# Patient Record
Sex: Female | Born: 1975 | Race: White | Hispanic: No | Smoking: Current every day smoker
Health system: Southern US, Community
[De-identification: ages and names within clinical notes are randomized; demographics above are authoritative.]

## PROBLEM LIST (undated history)

## (undated) DIAGNOSIS — J189 Pneumonia, unspecified organism: Secondary | ICD-10-CM

## (undated) DIAGNOSIS — R519 Headache, unspecified: Secondary | ICD-10-CM

## (undated) DIAGNOSIS — F32A Depression, unspecified: Secondary | ICD-10-CM

## (undated) DIAGNOSIS — R55 Syncope and collapse: Secondary | ICD-10-CM

## (undated) DIAGNOSIS — R51 Headache: Secondary | ICD-10-CM

## (undated) DIAGNOSIS — F329 Major depressive disorder, single episode, unspecified: Secondary | ICD-10-CM

## (undated) DIAGNOSIS — I10 Essential (primary) hypertension: Secondary | ICD-10-CM

## (undated) DIAGNOSIS — F419 Anxiety disorder, unspecified: Secondary | ICD-10-CM

## (undated) DIAGNOSIS — I639 Cerebral infarction, unspecified: Secondary | ICD-10-CM

## (undated) DIAGNOSIS — G35 Multiple sclerosis: Secondary | ICD-10-CM

## (undated) DIAGNOSIS — Z9289 Personal history of other medical treatment: Secondary | ICD-10-CM

## (undated) DIAGNOSIS — IMO0002 Reserved for concepts with insufficient information to code with codable children: Secondary | ICD-10-CM

## (undated) DIAGNOSIS — J45909 Unspecified asthma, uncomplicated: Secondary | ICD-10-CM

## (undated) HISTORY — PX: WISDOM TOOTH EXTRACTION: SHX21

## (undated) HISTORY — PX: OTHER SURGICAL HISTORY: SHX169

## (undated) HISTORY — PX: TUBAL LIGATION: SHX77

---
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
## 1998-03-20 ENCOUNTER — Emergency Department (HOSPITAL_COMMUNITY): Admission: EM | Admit: 1998-03-20 | Discharge: 1998-03-20 | Payer: Self-pay | Admitting: Emergency Medicine

## 1998-11-09 ENCOUNTER — Emergency Department (HOSPITAL_COMMUNITY): Admission: EM | Admit: 1998-11-09 | Discharge: 1998-11-09 | Payer: Self-pay | Admitting: Emergency Medicine

## 1998-12-08 ENCOUNTER — Other Ambulatory Visit: Admission: RE | Admit: 1998-12-08 | Discharge: 1998-12-08 | Payer: Self-pay | Admitting: Obstetrics and Gynecology

## 1999-05-06 ENCOUNTER — Inpatient Hospital Stay (HOSPITAL_COMMUNITY): Admission: AD | Admit: 1999-05-06 | Discharge: 1999-05-06 | Payer: Self-pay | Admitting: *Deleted

## 1999-06-17 ENCOUNTER — Inpatient Hospital Stay (HOSPITAL_COMMUNITY): Admission: AD | Admit: 1999-06-17 | Discharge: 1999-06-19 | Payer: Self-pay | Admitting: Obstetrics and Gynecology

## 2000-02-22 ENCOUNTER — Other Ambulatory Visit: Admission: RE | Admit: 2000-02-22 | Discharge: 2000-02-22 | Payer: Self-pay | Admitting: Family Medicine

## 2000-07-25 ENCOUNTER — Emergency Department (HOSPITAL_COMMUNITY): Admission: EM | Admit: 2000-07-25 | Discharge: 2000-07-25 | Payer: Self-pay | Admitting: *Deleted

## 2001-03-18 ENCOUNTER — Encounter: Payer: Self-pay | Admitting: *Deleted

## 2001-03-18 ENCOUNTER — Emergency Department (HOSPITAL_COMMUNITY): Admission: EM | Admit: 2001-03-18 | Discharge: 2001-03-18 | Payer: Self-pay | Admitting: *Deleted

## 2001-06-27 ENCOUNTER — Encounter: Payer: Self-pay | Admitting: Obstetrics and Gynecology

## 2001-06-27 ENCOUNTER — Emergency Department (HOSPITAL_COMMUNITY): Admission: EM | Admit: 2001-06-27 | Discharge: 2001-06-27 | Payer: Self-pay | Admitting: Emergency Medicine

## 2001-07-08 ENCOUNTER — Emergency Department (HOSPITAL_COMMUNITY): Admission: EM | Admit: 2001-07-08 | Discharge: 2001-07-08 | Payer: Self-pay | Admitting: *Deleted

## 2001-08-26 ENCOUNTER — Encounter: Payer: Self-pay | Admitting: Emergency Medicine

## 2001-08-26 ENCOUNTER — Emergency Department (HOSPITAL_COMMUNITY): Admission: EM | Admit: 2001-08-26 | Discharge: 2001-08-26 | Payer: Self-pay | Admitting: Emergency Medicine

## 2001-09-19 ENCOUNTER — Ambulatory Visit (HOSPITAL_COMMUNITY): Admission: RE | Admit: 2001-09-19 | Discharge: 2001-09-19 | Payer: Self-pay | Admitting: *Deleted

## 2001-09-19 ENCOUNTER — Encounter: Payer: Self-pay | Admitting: *Deleted

## 2001-12-01 ENCOUNTER — Inpatient Hospital Stay (HOSPITAL_COMMUNITY): Admission: AD | Admit: 2001-12-01 | Discharge: 2001-12-06 | Payer: Self-pay | Admitting: *Deleted

## 2001-12-02 ENCOUNTER — Encounter: Payer: Self-pay | Admitting: Obstetrics & Gynecology

## 2001-12-03 ENCOUNTER — Encounter: Payer: Self-pay | Admitting: Obstetrics & Gynecology

## 2002-01-30 ENCOUNTER — Ambulatory Visit (HOSPITAL_COMMUNITY): Admission: AD | Admit: 2002-01-30 | Discharge: 2002-01-30 | Payer: Self-pay | Admitting: *Deleted

## 2002-02-02 ENCOUNTER — Ambulatory Visit (HOSPITAL_COMMUNITY): Admission: AD | Admit: 2002-02-02 | Discharge: 2002-02-02 | Payer: Self-pay | Admitting: *Deleted

## 2002-02-06 ENCOUNTER — Inpatient Hospital Stay (HOSPITAL_COMMUNITY): Admission: AD | Admit: 2002-02-06 | Discharge: 2002-02-08 | Payer: Self-pay | Admitting: *Deleted

## 2002-12-04 ENCOUNTER — Emergency Department (HOSPITAL_COMMUNITY): Admission: EM | Admit: 2002-12-04 | Discharge: 2002-12-04 | Payer: Self-pay | Admitting: *Deleted

## 2002-12-06 ENCOUNTER — Ambulatory Visit (HOSPITAL_COMMUNITY): Admission: RE | Admit: 2002-12-06 | Discharge: 2002-12-06 | Payer: Self-pay | Admitting: *Deleted

## 2002-12-06 ENCOUNTER — Encounter: Payer: Self-pay | Admitting: *Deleted

## 2003-02-17 ENCOUNTER — Observation Stay (HOSPITAL_COMMUNITY): Admission: EM | Admit: 2003-02-17 | Discharge: 2003-02-17 | Payer: Self-pay | Admitting: *Deleted

## 2003-02-17 ENCOUNTER — Encounter: Payer: Self-pay | Admitting: Obstetrics and Gynecology

## 2003-10-01 ENCOUNTER — Ambulatory Visit (HOSPITAL_COMMUNITY): Admission: AD | Admit: 2003-10-01 | Discharge: 2003-10-01 | Payer: Self-pay | Admitting: Obstetrics & Gynecology

## 2004-10-02 ENCOUNTER — Emergency Department (HOSPITAL_COMMUNITY): Admission: EM | Admit: 2004-10-02 | Discharge: 2004-10-03 | Payer: Self-pay | Admitting: Emergency Medicine

## 2005-04-12 ENCOUNTER — Other Ambulatory Visit: Admission: RE | Admit: 2005-04-12 | Discharge: 2005-04-12 | Payer: Self-pay | Admitting: General Surgery

## 2005-04-30 ENCOUNTER — Inpatient Hospital Stay (HOSPITAL_COMMUNITY): Admission: EM | Admit: 2005-04-30 | Discharge: 2005-05-04 | Payer: Self-pay | Admitting: Emergency Medicine

## 2005-05-01 ENCOUNTER — Ambulatory Visit: Payer: Self-pay | Admitting: Physical Medicine & Rehabilitation

## 2005-05-02 ENCOUNTER — Encounter (INDEPENDENT_AMBULATORY_CARE_PROVIDER_SITE_OTHER): Payer: Self-pay | Admitting: Cardiology

## 2005-05-03 ENCOUNTER — Encounter: Payer: Self-pay | Admitting: Neurology

## 2005-05-24 ENCOUNTER — Encounter (HOSPITAL_COMMUNITY): Admission: RE | Admit: 2005-05-24 | Discharge: 2005-06-23 | Payer: Self-pay | Admitting: Neurology

## 2005-07-08 ENCOUNTER — Encounter (HOSPITAL_COMMUNITY): Admission: RE | Admit: 2005-07-08 | Discharge: 2005-07-22 | Payer: Self-pay | Admitting: Neurology

## 2006-01-31 ENCOUNTER — Emergency Department (HOSPITAL_COMMUNITY): Admission: EM | Admit: 2006-01-31 | Discharge: 2006-02-01 | Payer: Self-pay | Admitting: Emergency Medicine

## 2006-07-05 ENCOUNTER — Emergency Department (HOSPITAL_COMMUNITY): Admission: EM | Admit: 2006-07-05 | Discharge: 2006-07-05 | Payer: Self-pay | Admitting: Emergency Medicine

## 2006-07-08 ENCOUNTER — Emergency Department (HOSPITAL_COMMUNITY): Admission: EM | Admit: 2006-07-08 | Discharge: 2006-07-08 | Payer: Self-pay | Admitting: Emergency Medicine

## 2006-11-23 ENCOUNTER — Encounter (INDEPENDENT_AMBULATORY_CARE_PROVIDER_SITE_OTHER): Payer: Self-pay | Admitting: *Deleted

## 2006-11-23 ENCOUNTER — Inpatient Hospital Stay (HOSPITAL_COMMUNITY): Admission: EM | Admit: 2006-11-23 | Discharge: 2006-11-23 | Payer: Self-pay | Admitting: Emergency Medicine

## 2008-03-20 ENCOUNTER — Other Ambulatory Visit: Admission: RE | Admit: 2008-03-20 | Discharge: 2008-03-20 | Payer: Self-pay | Admitting: General Surgery

## 2008-07-19 ENCOUNTER — Emergency Department (HOSPITAL_COMMUNITY): Admission: EM | Admit: 2008-07-19 | Discharge: 2008-07-19 | Payer: Self-pay | Admitting: Emergency Medicine

## 2008-08-27 ENCOUNTER — Emergency Department (HOSPITAL_COMMUNITY): Admission: EM | Admit: 2008-08-27 | Discharge: 2008-08-27 | Payer: Self-pay | Admitting: Emergency Medicine

## 2009-03-23 ENCOUNTER — Emergency Department (HOSPITAL_COMMUNITY): Admission: EM | Admit: 2009-03-23 | Discharge: 2009-03-23 | Payer: Self-pay | Admitting: Emergency Medicine

## 2010-01-19 ENCOUNTER — Emergency Department (HOSPITAL_COMMUNITY): Admission: EM | Admit: 2010-01-19 | Discharge: 2010-01-19 | Payer: Self-pay | Admitting: Emergency Medicine

## 2010-02-04 ENCOUNTER — Emergency Department (HOSPITAL_COMMUNITY): Admission: EM | Admit: 2010-02-04 | Discharge: 2010-02-04 | Payer: Self-pay | Admitting: Emergency Medicine

## 2010-04-23 ENCOUNTER — Emergency Department (HOSPITAL_COMMUNITY): Admission: EM | Admit: 2010-04-23 | Discharge: 2010-04-23 | Payer: Self-pay | Admitting: Emergency Medicine

## 2010-09-04 ENCOUNTER — Emergency Department (HOSPITAL_COMMUNITY)
Admission: EM | Admit: 2010-09-04 | Discharge: 2010-09-04 | Payer: Self-pay | Source: Home / Self Care | Admitting: Oncology

## 2010-09-04 LAB — URINE MICROSCOPIC-ADD ON

## 2010-09-04 LAB — URINALYSIS, ROUTINE W REFLEX MICROSCOPIC
Bilirubin Urine: NEGATIVE
Hgb urine dipstick: NEGATIVE
Ketones, ur: NEGATIVE mg/dL
Nitrite: POSITIVE — AB
Protein, ur: NEGATIVE mg/dL
Specific Gravity, Urine: 1.01 (ref 1.005–1.030)
Urine Glucose, Fasting: NEGATIVE mg/dL
Urobilinogen, UA: 0.2 mg/dL (ref 0.0–1.0)
pH: 6.5 (ref 5.0–8.0)

## 2010-10-25 LAB — DIFFERENTIAL
Basophils Absolute: 0 10*3/uL (ref 0.0–0.1)
Basophils Relative: 0 % (ref 0–1)
Eosinophils Absolute: 0.1 10*3/uL (ref 0.0–0.7)
Eosinophils Relative: 1 % (ref 0–5)
Lymphocytes Relative: 18 % (ref 12–46)
Lymphs Abs: 2.1 10*3/uL (ref 0.7–4.0)
Monocytes Absolute: 0.9 10*3/uL (ref 0.1–1.0)
Monocytes Relative: 8 % (ref 3–12)
Neutro Abs: 8.9 10*3/uL — ABNORMAL HIGH (ref 1.7–7.7)
Neutrophils Relative %: 74 % (ref 43–77)

## 2010-10-25 LAB — COMPREHENSIVE METABOLIC PANEL
ALT: 13 U/L (ref 0–35)
AST: 20 U/L (ref 0–37)
Albumin: 3.7 g/dL (ref 3.5–5.2)
Alkaline Phosphatase: 62 U/L (ref 39–117)
BUN: 6 mg/dL (ref 6–23)
CO2: 23 mEq/L (ref 19–32)
Calcium: 9.1 mg/dL (ref 8.4–10.5)
Chloride: 105 mEq/L (ref 96–112)
Creatinine, Ser: 0.7 mg/dL (ref 0.4–1.2)
GFR calc Af Amer: >60 mL/min (ref >60)
GFR calc non Af Amer: >60 mL/min (ref >60)
Glucose, Bld: 89 mg/dL (ref 70–99)
Potassium: 3.3 mEq/L — ABNORMAL LOW (ref 3.5–5.1)
Sodium: 137 mEq/L (ref 135–145)
Total Bilirubin: 0.4 mg/dL (ref 0.3–1.2)
Total Protein: 6.8 g/dL (ref 6.0–8.3)

## 2010-10-25 LAB — URINALYSIS, ROUTINE W REFLEX MICROSCOPIC
Bilirubin Urine: NEGATIVE
Glucose, UA: NEGATIVE mg/dL
Ketones, ur: NEGATIVE mg/dL
Nitrite: POSITIVE — AB
Protein, ur: 100 mg/dL — AB
Specific Gravity, Urine: 1.015 (ref 1.005–1.030)
Urobilinogen, UA: 0.2 mg/dL (ref 0.0–1.0)
pH: 6 (ref 5.0–8.0)

## 2010-10-25 LAB — CBC
HCT: 35.9 % — ABNORMAL LOW (ref 36.0–46.0)
Hemoglobin: 12.2 g/dL (ref 12.0–15.0)
MCHC: 34.1 g/dL (ref 30.0–36.0)
MCV: 94.1 fL (ref 78.0–100.0)
Platelets: 224 10*3/uL (ref 150–400)
RBC: 3.82 MIL/uL — ABNORMAL LOW (ref 3.87–5.11)
RDW: 14.1 % (ref 11.5–15.5)
WBC: 12.1 10*3/uL — ABNORMAL HIGH (ref 4.0–10.5)

## 2010-10-25 LAB — POCT PREGNANCY, URINE: Preg Test, Ur: NEGATIVE

## 2010-10-25 LAB — URINE MICROSCOPIC-ADD ON

## 2010-11-13 LAB — URINALYSIS, ROUTINE W REFLEX MICROSCOPIC
Bilirubin Urine: NEGATIVE
Glucose, UA: NEGATIVE mg/dL
Ketones, ur: NEGATIVE mg/dL
Nitrite: POSITIVE — AB
Protein, ur: 30 mg/dL — AB
Specific Gravity, Urine: <1.005 — ABNORMAL LOW (ref 1.005–1.030)
Urobilinogen, UA: 0.2 mg/dL (ref 0.0–1.0)
pH: 5.5 (ref 5.0–8.0)

## 2010-11-13 LAB — URINE MICROSCOPIC-ADD ON

## 2010-11-19 ENCOUNTER — Emergency Department (HOSPITAL_COMMUNITY)
Admission: EM | Admit: 2010-11-19 | Discharge: 2010-11-20 | Disposition: A | Discharge status: Home / Self Care | Payer: Medicaid Other | Attending: Emergency Medicine | Admitting: Emergency Medicine

## 2010-11-19 DIAGNOSIS — M546 Pain in thoracic spine: Secondary | ICD-10-CM | POA: Insufficient documentation

## 2010-11-19 DIAGNOSIS — R350 Frequency of micturition: Secondary | ICD-10-CM | POA: Insufficient documentation

## 2010-11-19 DIAGNOSIS — M545 Low back pain, unspecified: Secondary | ICD-10-CM | POA: Insufficient documentation

## 2010-11-19 DIAGNOSIS — N390 Urinary tract infection, site not specified: Secondary | ICD-10-CM | POA: Insufficient documentation

## 2010-11-19 DIAGNOSIS — F411 Generalized anxiety disorder: Secondary | ICD-10-CM | POA: Insufficient documentation

## 2010-11-19 DIAGNOSIS — E119 Type 2 diabetes mellitus without complications: Secondary | ICD-10-CM | POA: Insufficient documentation

## 2010-11-20 LAB — URINALYSIS, ROUTINE W REFLEX MICROSCOPIC
Bilirubin Urine: NEGATIVE
Glucose, UA: NEGATIVE mg/dL
Nitrite: POSITIVE — AB
Protein, ur: 100 mg/dL — AB
Specific Gravity, Urine: 1.01 (ref 1.005–1.030)
Urobilinogen, UA: 0.2 mg/dL (ref 0.0–1.0)
pH: 6 (ref 5.0–8.0)

## 2010-11-20 LAB — PREGNANCY, URINE: Preg Test, Ur: NEGATIVE

## 2010-11-20 LAB — URINE MICROSCOPIC-ADD ON

## 2010-11-22 LAB — POCT CARDIAC MARKERS
CKMB, poc: <1 ng/mL — ABNORMAL LOW (ref 1.0–8.0)
Myoglobin, poc: 30 ng/mL (ref 12–200)
Troponin i, poc: <0.05 ng/mL (ref 0.00–0.09)

## 2010-11-22 LAB — PROTIME-INR
INR: 1 (ref 0.00–1.49)
Prothrombin Time: 13.7 seconds (ref 11.6–15.2)

## 2010-11-22 LAB — RAPID URINE DRUG SCREEN, HOSP PERFORMED
Amphetamines: NOT DETECTED
Barbiturates: NOT DETECTED
Benzodiazepines: NOT DETECTED
Cocaine: NOT DETECTED
Opiates: NOT DETECTED
Tetrahydrocannabinol: POSITIVE — AB

## 2010-11-22 LAB — DIFFERENTIAL
Basophils Absolute: 0 10*3/uL (ref 0.0–0.1)
Basophils Relative: 0 % (ref 0–1)
Eosinophils Absolute: 0.1 10*3/uL (ref 0.0–0.7)
Eosinophils Relative: 1 % (ref 0–5)
Lymphocytes Relative: 26 % (ref 12–46)
Lymphs Abs: 2.3 10*3/uL (ref 0.7–4.0)
Monocytes Absolute: 0.5 10*3/uL (ref 0.1–1.0)
Monocytes Relative: 6 % (ref 3–12)
Neutro Abs: 5.9 10*3/uL (ref 1.7–7.7)
Neutrophils Relative %: 67 % (ref 43–77)

## 2010-11-22 LAB — COMPREHENSIVE METABOLIC PANEL
ALT: 14 U/L (ref 0–35)
AST: 26 U/L (ref 0–37)
Albumin: 3.5 g/dL (ref 3.5–5.2)
Alkaline Phosphatase: 61 U/L (ref 39–117)
BUN: 5 mg/dL — ABNORMAL LOW (ref 6–23)
CO2: 26 mEq/L (ref 19–32)
Calcium: 8.8 mg/dL (ref 8.4–10.5)
Chloride: 109 mEq/L (ref 96–112)
Creatinine, Ser: 0.6 mg/dL (ref 0.4–1.2)
GFR calc Af Amer: >60 mL/min (ref >60)
GFR calc non Af Amer: >60 mL/min (ref >60)
Glucose, Bld: 75 mg/dL (ref 70–99)
Potassium: 3.6 mEq/L (ref 3.5–5.1)
Sodium: 140 mEq/L (ref 135–145)
Total Bilirubin: 0.4 mg/dL (ref 0.3–1.2)
Total Protein: 6.1 g/dL (ref 6.0–8.3)

## 2010-11-22 LAB — GLUCOSE, CAPILLARY: Glucose-Capillary: 76 mg/dL (ref 70–99)

## 2010-11-22 LAB — CBC
HCT: 34.5 % — ABNORMAL LOW (ref 36.0–46.0)
Hemoglobin: 11.6 g/dL — ABNORMAL LOW (ref 12.0–15.0)
MCHC: 33.7 g/dL (ref 30.0–36.0)
MCV: 92.4 fL (ref 78.0–100.0)
Platelets: 249 10*3/uL (ref 150–400)
RBC: 3.74 MIL/uL — ABNORMAL LOW (ref 3.87–5.11)
RDW: 16.3 % — ABNORMAL HIGH (ref 11.5–15.5)
WBC: 8.7 10*3/uL (ref 4.0–10.5)

## 2010-11-22 LAB — PREGNANCY, URINE: Preg Test, Ur: NEGATIVE

## 2010-11-22 LAB — ETHANOL: Alcohol, Ethyl (B): 7 mg/dL (ref 0–10)

## 2010-11-22 LAB — APTT: aPTT: 28 seconds (ref 24–37)

## 2010-12-24 NOTE — H&P (Signed)
[facility]  Patient:    [name], [name] Visit Number: [id number] MRN: [id number]          Service Type: MED Location: 4A A419 01 Attending Physician:  Jeri Cos. Dictated by:   Langley Gauss, M.D. Admit Date:  11/30/2001 Discharge Date: 12/06/2001                           History and Physical  HISTORY OF PRESENT ILLNESS:  This is a 35 year old gravida 3, para 2 at 28-4/[redacted] weeks gestation, who presents with the acute onset of right-sided back pain the a.m. of November 30, 2001.  The patient presented to [facility] with that evaluation.  She denies any rupture of membranes, any vaginal bleeding.  She has reported good fetal movement and no appetite changes, no GI symptoms.  She did not have any dysuria, frequency, urgency or hematuria.  The patients prenatal course has been complicated by one prior treatment as an outpatient at [redacted] weeks gestation for bacteriuria, which was asymptomatic likewise at that time.  ALLERGIES:  The patient states she is allergic to PENICILLIN, CODEINE and SULFA.  CURRENT MEDICATIONS:  Prenatal vitamins.  PAST OBSTETRICAL HISTORY:  She has had two prior vaginal deliveries without complication.  PHYSICAL EXAMINATION:  GENERAL:  On physical examination, the patient appears slightly apprehensive.  VITAL SIGNS:  Temperature 101.1, and 98.1; pulse of 144; respiratory rate is 20; blood pressure 90/47.  HEENT/NECK:  Negative.  There is no adenopathy.  Neck is supple.  Thyroid is nonpalpable.  Mucous membranes are moist.  ABDOMEN:  Abdomen reveals a gravid uterus consistent with [redacted] weeks gestation. No uterine tenderness is elicited.  Fetal heart tones are documented.  EXTREMITIES:  Extremities are noted to be normal.  PELVIC:  Examination not performed, although no evidence of any vaginal bleeding or leakage of fluid are identified.  LUNGS:  Clear with decreased breath sounds.  BACK:  There is noted to  be moderate right CVA tenderness but none on the left.  LABORATORY AND ACCESSORY DATA:  Laboratory studies revealed hemoglobin 10.1, hematocrit 29.2 and an elevated white blood count of 31.2.  Urinalysis pertinent for ketones and evidence of leukocyte esterase.  IMPRESSION:  Initial impression, December 01, 2001, early pyelonephritis with elevated white blood cells, patient treated with intravenous Rocephin. Although she has stated an allergy to penicillin, she has previously taken cephalosporins without any difficulty.  Blood cultures were obtained upon admission.  Patient treated December 01, 2001 for presumed pyelonephritis, with culture currently pending. Dictated by:   Langley Gauss, M.D. Attending Physician:  Jeri Cos. DD:  01/03/02 TD:  01/04/02 Job: [id number]

## 2010-12-24 NOTE — Discharge Summary (Signed)
[facility]  Patient:    [name], [name] Visit Number: [id number] MRN: [id number]          Service Type: MED Location: 4A A419 01 Attending Physician:  Jeri Cos. Dictated by:   Langley Gauss, M.D. Admit Date:  11/30/2001 Discharge Date: 12/06/2001   CC:         Kari Baars, M.D.   Discharge Summary  FINAL DIAGNOSES:  Left lower lobe pneumonia.  DISCHARGE MEDICATIONS:  Continue p.o. medications with a Z-Pak.  FOLLOWUP:  Follow up in the office in 2 weeks time for continued care.  The patient likewise has an Alupent inhaler for respiratory wheezing.  CONSULTATIONS:  December 03, 2001, patient evaluated in consultation by Dr. Kari Baars for respiratory distress with tachypnea.  HOSPITAL COURSE:  The patient at [redacted] weeks gestation to present to [facility] on the p.m. of December 01, 2001, with primarily complaints of back pain.  Initial impression was that of pyelonephritis.  Due to a urinalysis which was positive for leukocyte esterase with evidence of acute aneuria.  The patient was admitted at that time and started on IV fluids and IV Rocephin for broad-spectrum coverage.  The patient remained afebrile.  Final urine culture did reveal 30,000 colonies per mL of organism felt not to represent contamination and not a pathogen.  Hospital care then provided by Dr. Duane Lope, on December 02, 2001, after initial admission by Dr. Lisette Grinder on December 01, 2001.  On December 02, 2001, the patient was noted to spike a temperature to 101.8 degrees and became tachypneic with a respiratory rate of 24, and an increasing pulse to 142.  The patient was evaluated at that time and noted to have decreased breath sounds in the left lower lobe but no rales.  Chest x-ray was performed which revealed evidence of a left lower lobe infiltrate though to represent either Mycoplasma pneumonia or bronchitis.  The patient was continued on the IV Rocephin at  that time and Zithromax was added to her antibiotic regimen to cover for Mycoplasma pneumonia.  On December 03, 2001, the patient was evaluated by Dr. Kari Baars who agreed with the diagnosis.  He, however, was concerned regarding the possibility of a pulmonary emboli, thus a chest CT was performed which did not give any suggestion of a pulmonary emboli.  Likewise on December 03, 2001, an ultrasound was obtained of the pregnancy to evaluate for any possible concealed abruption.  Sonogram at that time revealed a normal intrauterine pregnancy at 29 plus weeks gestation with appropriate fetal growth and normal appearing placenta.  There, however, was minor bilateral renal pyelectasis with recommendation for a repeat followup scan. Thus, no changes were made in patients care at that time.  On December 03, 2001, patient was noted to be improving.  The patient was treated with Solu-Medrol 125 mg IV q.6h. short term use by Dr. Kari Baars.  Continuing care provided for the patient by Dr. Roylene Reason. Lisette Grinder who resumed care of the patient on December 04, 2001.  At that time the breathing was noted to be improved.  She did have occasional wheezing.  She had developed somewhat of a productive cough with coughing up phlegm.  The patient was noted to be afebrile at this time.  She was receiving O2 by nasal cannula only with saturations always greater than 95%.  Non-stress test obtained on December 03, 2001, revealed a fetal heart rate baseline of 150.  There were accelerations noted greater than  15 beats per minute x greater than 15 seconds duration. Long term variability noted to be normal.  No fetal heart rate decelerations were noted.  No uterine contractions were identified.  On December 03, 2001, the patient was transfused 1 unit of packed red blood cells for anemia.  As her initial hemoglobin and hematocrit of 10.1, with hematocrit 29.2, had been followed down to a low point of hemoglobin 7.4,  hematocrit 21.4.  One unit of packed red blood cells were transfused to improve patients oxygenation.  After this 1 unit serial hematocrits were performed.  On December 04, 2001, hematocrit of 22.0, and at time of discharge December 05, 2001, hematocrit of 22.3.  The patient was tolerating the anemia very well, and was breathing without undue respiratory effort.  Saturations remained greater than 95%.  The patient was ambulatory.  Thus, no additional blood units were transfused.  In addition coagulation studies were performed.  Fibrinogen, PT and PTT within normal limits.  Electrolytes likewise within normal limits.  A peripheral smear was obtained which did not reveal any evidence of hemolysis.  Red blood cell morphology appeared normal.  There was a suggestion of toxic granulation of the white blood cells which would be consistent with an infectious process.  Thereafter the patient continued to have marked improvement.  She did not require any additional blood transfusions.  Oxygen was discontinued.  The patient was evaluated on Dec 06, 2001, after which time she had remained afebrile.  Breathing was markedly improved.  Likewise, back and left lower lobe pain were markedly improved, thus patient was appropriate for discharge on Dec 06, 2001.  The patient was discharged to home to continue with the Z-Pak and Alupent nebulizer. Dictated by:   Langley Gauss, M.D. Attending Physician:  Jeri Cos. DD:  01/03/02 TD:  01/04/02 Job: [id number]

## 2010-12-24 NOTE — H&P (Signed)
NAMEXARENI, KELCH              ACCOUNT NO.:  [id number]   MEDICAL RECORD NO.:  [id number]          PATIENT TYPE:  INP   LOCATION:  5031                         FACILITY:  [facility]   PHYSICIAN:  Bevelyn Buckles. Champey, M.D.DATE OF BIRTH:  September 17, 1975   DATE OF ADMISSION:  05/01/2005  DATE OF DISCHARGE:                                HISTORY & PHYSICAL   CHIEF COMPLAINT:  Left-sided weakness.   HISTORY OF PRESENT ILLNESS:  Ms. Pesola is a 35 year old Caucasian female  with past medical history of questionable diabetes and asthma, who initially  presented to an outside hospital/[facility] on April 29, 2005, with symptoms of left-sided weakness in the face, upper extremity,  and lower extremity.  The patient stated that she had a fall with loss of  consciousness the day prior and felt fine that day.  When she woke up Friday  afternoon after her nap, she felt left-sided weakness and numbness primarily  in her face, arm, and leg.  She states her symptoms have been slowly  improving especially with physical therapy at the outside hospital.  With  the onset of those symptoms, the patient also had altered and garbled  speech.  She also complained of headache and right neck pain with the onset  symptoms.  The patient did have workup at [facility] with  an MRI/MRA that showed no evidence of acute stroke, but did show findings on  MRA suggestive of a right vertebral dissection.  The patient was placed on  heparin.  The patient was transferred to [facility] today for  further treatment and care.  The patient states that she does feel unsteady  on her feet secondary to left-sided weakness.  She denies any vision  changes, dizziness, vertigo, swallowing problems, or chewing problems.   PAST MEDICAL HISTORY:  Positive for asthma and questionable diabetes.   CURRENT MEDICATIONS:  1.  Albuterol p.r.n.  2.  Insulin.  3.  Protonix from an outside  hospital.   ALLERGIES:  The patient has drug allergies to PENICILLIN, SULFA, CODEINE.   FAMILY HISTORY:  Positive for hypertension and lupus.   SOCIAL HISTORY:  The patient currently lives with her kids and husband.  She  smokes 1/2 pack of cigarettes per day for the past 10 years and will  occasionally drink alcohol and occasionally smoke marijuana cigarette.   REVIEW OF SYSTEMS:  Positive for left-sided weakness, numbness, garbled  speech, slight headache, and right neck pain.  Review of systems negative as  per history of present illness and greater than nine other organ systems.   PHYSICAL EXAMINATION:  VITAL SIGNS:  Temperature 97.1, blood pressure 98/66,  pulse 72, respirations 16, oxygen saturation 100% on room air.  HEENT:  Normocephalic and atraumatic.  Extraocular muscles intact.  Pupils  equal, round, and reactive to light.  NECK:  Supple.  No carotid bruits.  HEART:  Regular.  LUNGS:  Clear.  ABDOMEN:  Soft and nontender.  EXTREMITIES:  No  edema and good pulses.  NEUROLOGY:  The patient is alert and oriented x3.  The  patient has garbled  speech.  Cranial nerves; extraocular muscles intact, pupils equal, round,  and reactive to light.  The patient draws her face to the right at times  stating her left side is weak.  Tongue initially goes to the right and when  repeated it goes straight.  The patient had decreased sensation on the left  side of her face and does have a positive tuning fork sign (discrepancy when  checking frontal plates).  Hearing is intact.  Motor examination; the  patient has 5/5 strength on the right and questionably on the left.  The  patient does give way on the left and when asked to give full effort, she  has at least 4+/5 strength.  The patient has normal tone throughout.  The  patient does have a left downward drift.  Sensory examination; the patient  has decreased sensation to light touch and pinprick on the left when  compared to the right.   Reflexes are 1-2+ throughout.  Toes are neutral  bilaterally.  Cerebellar function is within normal limits.  Finger-to-nose  and rapid alternating movements.  Gait; the patient drags her left foot  awkwardly during a few steps.  She seems unsteady, however, has had no risk  of falling.  The patient can turn and get in and out of bed on her own.   LABORATORY DATA:  WBC 8.6, hemoglobin 11.4, hematocrit 33.0, platelets 212.  Unfractionated heparin level was 0.49, sodium 139, potassium 3.6, chloride  106, bicarb 25, BUN 13, creatinine 0.8, glucose 92, calcium 8.6.  CK 62, MB  0.6, troponin 0.01, cholesterol 160, triglycerides 157.  Urinalysis is  negative.  Urine toxicology screen was positive for opiates and cannabis.  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   ASSESSMENT:  Ms. Loveall is a 35 year old Caucasian female with left-sided  weakness and possible right vertebral dissection.  The patient does have  some signs and symptoms on examination that are suggestive of possibly a  functional examination.  The MRI/MRA was reviewed and does show a possible  right vertebral dissection.  She is currently on IV heparin protocol and  will continue this for now.  We will discuss the results of the MRA with  Pramod P. Pearlean Brownie, M.D. and also to decide whether to proceed with a cerebral  angiogram.  There is no acute stroke seen on MRI, however, the MRI can  occasionally miss small lacunar strokes.  We will PT/OT consults as the  patient has been improving in her strength with this.  We will follow her  sugars for now and hold her insulin as the patient has had no elevation of  her sugars at the outside hospital being off insulin.           ______________________________  Bevelyn Buckles. Nash Shearer, M.D.     DRC/MEDQ  D:  05/01/2005  T:  05/02/2005  Job:  [id number]

## 2010-12-24 NOTE — Group Therapy Note (Signed)
[facility]  Patient:    [name], [name] Visit Number: [id number] MRN: [id number]          Service Type: MED Location: 4A A419 01 Attending Physician:  Jeri Cos. Dictated by:   Kari Baars, M.D. Admit Date:  11/30/2001                               Progress Note  Patient of Dr. Ferne Coe.  SUBJECTIVE:  [name] says she is feeling a great deal better this morning.  She has no new complaints.  She says she is moving air better.  She feels better.  Overall things are improved.  She denies any other new complaints.  PHYSICAL EXAMINATION  CHEST:  Quite clear.  HEART:  Regular.  ABDOMEN:  Soft.  EXTREMITIES:  No edema.  LABORATORIES:  White count now down to 8000, hemoglobin 7.5, hematocrit 22, platelets 142,000.  ASSESSMENT:  She seems much better.  PLAN:  Continue her treatment including her antibiotics as before and she is to be, I think, lung wise she will be ready for discharge whenever she is ready from a gynecologic point of view. Dictated by:   Kari Baars, M.D. Attending Physician:  Jeri Cos. DD:  12/04/01 TD:  12/05/01 Job: [id number]

## 2010-12-24 NOTE — Discharge Summary (Signed)
[name], [name]              ACCOUNT NO.:  [id number]   MEDICAL RECORD NO.:  [id number]          PATIENT TYPE:  INP   LOCATION:  3016                         FACILITY:  [facility]   PHYSICIAN:  Pramod P. Pearlean Brownie, MD    DATE OF BIRTH:  1975/08/24   DATE OF ADMISSION:  05/01/2005  DATE OF DISCHARGE:  05/04/2005                                 DISCHARGE SUMMARY   DIAGNOSES AT TIME OF DISCHARGE:  1.  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  2.  Asthma.  3.  Questionable diabetes.  4.  Drug screen positive for opiates and THC.   MEDICINES AT TIME OF DISCHARGE:  1.  Aspirin 81 mg daily.  2.  Multivitamin daily.   STUDIES PERFORMED:  1.  MRI/MRA done at [facility] just prior to admission showed no acute      stroke.  2.  CT on admission shows no acute abnormalities.  3.  Chest x-ray shows no acute disease.  4.  MRI of the brain on April 30, 2005 is normal and MRA of the brain      shows no intracranial stenosis or occlusion, no aneurysm, no vertebral      artery is dominant.  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  5.  Cerebral angiogram performed on May 03, 2005 showed no      angiographic evidence of occlusion, stenosis, dissection, intraluminal      filling defects or other abnormalities.  No aneurysms.  6.  Repeat MRI on May 03, 2005 showed no acute stroke.  7.  2D echocardiogram shows EF of 55-65% with no left ventricular regional      wall motion abnormalities, no acute stroke.  8.  Telemetry strips show normal sinus rhythm.   LABORATORY STUDIES:  Antithrombin-3 is __________, homocystine is 8.10 with  the rest of hypercoagulable panel pending.  CRP is 0.8, TSH 0.796,  cholesterol 150, triglycerides 157, urinalysis is negative, urine drug  screen positive for opiates and THC.  Hemoglobin A1c 5.5, sed rate 7, ANA  negative, urine pregnancy test negative.  __________ old Caucasian female with past medical history of questionable  diabetes and asthma who presented to [facility] on April 29, 2005 with __________ sided weakness in the face, upper extremity and lower  extremity.  The patient stated she had a fall with loss of consciousness the  day prior to that, but felt fine that day; when she awoke the afternoon she  felt left-sided weakness and numbness primarily in her face and arm.  She  states her symptoms have been slowly improving especially with physical  therapy in the outside hospital.  Accompanied with these symptoms are also  garbled speech and headache with right neck pain.  Workup at [facility] had a negative MRI.  On May 01, 2005 the patient was  transferred to [facility] for further treatment and care as her  primary no longer felt comfortable managing her.  On  arrival she does  continue to complain of unsteadiness on her feet and has altered speech  pattern.  Further workup for infection needs to be completed.   HOSPITAL COURSE:  Cerebral angiogram was negative for acute abnormalities.  No dissection aneurysm or other findings noted.  Repeat MRI with thin-slice  DWI revealed no acute stroke.  After some discussion the patient did admit  to having increased stress at home including four children, an ex-husband  who is having an affair with her sister, and a finance with whom she is  having difficulties.  The patient agreeable to being referred to Mental  Health at the time of discharge, a psychiatrist has not yet seen during this  hospitalization and she is not a threat to herself or others.  She will be  discharged home with family, will follow-up with medical physician and  Mental Health in [facility].  Of note, the patient was on insulin  prior to admission.  Her sugars have remained normal.  Her hemoglobin A1c is  normal.  She was removed from insulin as sugars stayed within  normal limits.  Will not resume insulin at time of discharge and refer back to primary care  physician.   CONDITION ON DISCHARGE:  The patient alert and oriented x3.  Speech robotic  and forced with some slurring.  No aphasia.  Left facial weakness has  resolved.  Her gait remained unsteady, again slow and robotic even with  walker.  Her chest is clear to auscultation, her heart rate is regular.   DISCHARGE PLAN:  1.  Discharge home.  2.  Aspirin 81 mg daily.  3.  Home health or outpatient physical therapy, occupational therapy and      speech therapy.  4.  Follow up with [facility], Monday, May 16, 2005 at 2:00      p.m.  Appointment is with [name].  5.  Follow up with the [facility] in [place] or primary care      physician within the next month.  6.  No need for neurologic followup.      Annie Main, N.P.    ______________________________  [name]. Pearlean Brownie, MD    SB/MEDQ  D:  05/04/2005  T:  05/04/2005  Job:  [id number]   cc:   Katina Dung [facility] [place] [facility] at [facility]

## 2010-12-24 NOTE — Discharge Summary (Signed)
[name], Reilly              ACCOUNT NO.:  [id number]   MEDICAL RECORD NO.:  [id number]          PATIENT TYPE:  EMS   LOCATION:  MAJO                         FACILITY:  [facility]   PHYSICIAN:  Pramod P. Pearlean Brownie, MD    DATE OF BIRTH:  01/18/1976   DATE OF ADMISSION:  11/23/2006  DATE OF DISCHARGE:  11/23/2006                               DISCHARGE SUMMARY   DIAGNOSES AT TIME OF DISCHARGE:  1. 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  2. History of conversion reaction right brain/left body 2006.  3. Cigarette smoker.   MEDICINES AT THE TIME OF DISCHARGE:  1. Aspirin 81 mg a day.  2. NicoDerm patch 14 mg a day x14 days and decrease to 7 mg a day x14      days and then discontinue.   STUDIES PERFORMED:  1. CT of the brain on admission showed no acute abnormality.  2. MRI of the brain showed no acute stroke.  3. MRI of the brain with formal results are pending at the time of      discharge.  4. 2D echocardiogram completed, results pending.  5. Transcranial Doppler completed, results pending.  6. Carotid Doppler shows no ICA stenosis.  7. EEG completed, results pending.  8. EKG shows normal sinus rhythm with incomplete right bundle branch      block.   LABORATORY STUDIES:  ABG normal.  CBC with hemoglobin of 10.6, otherwise  normal.  Chemistry with BUN 4, otherwise normal.  Coagulation studies  normal.  Liver function tests with albumin 3.2, otherwise normal.  Cholesterol 108, triglycerides 87, HDL 25, and LDL 66.  Urinalysis not  performed.  Hemoglobin A1c pending.  Homocysteine pending.  Alcohol  level less than 5.  Cardiac enzymes not performed.   HISTORY OF PRESENT ILLNESS:  Ms. 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  She was treated with aspirin alone without  any etiology  uncovered.  The night of admission after dinner, she went  to the bathroom and apparently collapsed on the floor, unwitnessed.  Not  known if she had any compulsive activity or hit her head.  Her husband  apparently heard her and came in, helped her get to the bed.  In the  meanwhile, he went to the bathroom.  Again he heard her fall, and she  had fallen off the bed.  He came to look at her, and she could not talk  right and had worsening left hemiparesis language.  She was brought to  the hospital for emergency evaluation.  She would probably then  evaluated.  Dr. Orlin Hilding was suspicious of stroke diagnosis and performed  an MRI, which showed no acute stroke.  She was not a tPA candidate  secondary to no stroke diagnosis.  She was admitted to the hospital for  further evaluation.   HOSPITAL COURSE:  MRI as before was negative for acute stroke, unable to  obtain  records from [facility]; however, she was seen at [facility] on September of 2006, also for a right brain stroke that was  found to be a conversion reaction.  She followed up with mental health  at [facility] at the end of that admission.   Physical exam during this hospitalization also confirmed conversion  reaction.  She talks in a baby talk pace but is not aphasic.  Her speech  is clear though nonsensical at times, though she follows commands  without difficulty.  Her eye movements are full.  She has subjective  left-sided weakness and variable strength.  She has poor effort.  Her  arm will not hit her face when held over her head with her eyes closed  on the left.  She has no sensation loss.   CONDITION AT DISCHARGE:  The patient is alert and oriented x3.  No  aphasia or dysarthria.  Eye movements are full.  Subjective left-sided  weakness, variable strength, poor effort, decreased left-sided weakness,  no sensory loss.   DISCHARGE PLAN:  1. Discharge home with family.  2. Continue aspirin.  3. Nicoderm  patch at the patient's request.  4. Follow up at [facility] for blood pressure      evaluation.  5. Follow up at [facility] for conversion      reaction.  6. Follow up with Dr. Orlin Hilding in 1 to 2 months for hospital followup.  7. Follow up tests performed at the hospital that are pending at the      time of discharge.      Annie Main, N.P.    ______________________________  Sunny Schlein. Pearlean Brownie, MD    SB/MEDQ  D:  11/23/2006  T:  11/23/2006  Job:  [id number]   cc:   Santina Evans A. Orlin Hilding, M.D.  [facility] Mental Health  [facility]

## 2010-12-24 NOTE — Consult Note (Signed)
[facility]  Patient:    BRIER, FIREBAUGH Visit Number: [id number] MRN: [id number]          Service Type: EMS Location: ED Attending Physician:  Annamarie Dawley Dictated by:   Langley Gauss, M.D. Admit Date:  06/27/2001 Discharge Date: 06/27/2001                            Consultation Report  REASON FOR CONSULTATION:  The patient is a 35 year old patient with an unreliable last menstrual period who presented to the emergency room by rescue squad complaining of severe pelvic pain. The patient is noted to be gravida 3, para 2. The patient states she is having contractions about eight minutes apart and noted to be breathing heavily during what appeared to be uterine contractions.  PAST MEDICAL HISTORY:  Pertinent only for asthma for which she takes Advair and a Proventil inhaler.  PHYSICAL EXAMINATION:  GENERAL:  Patient in no acute distress.  ABDOMEN:  Soft and nontender. No pelvic pain at the time of my evaluation.  PERTINENT LABORATORY DATA:  Transvaginal ultrasound was performed after initial evaluation by the ER staff revealing intrauterine pregnancy with fetal cardiac activity identified with a gestational age of about six weeks gestation. No free fluid identified, no pelvic masses identified. The patient is noted to have hemoglobin 11.2, hematocrit 32.1, white count of 8.8.  REVIEW OF SYSTEMS:  Pertinent for no bowel movement x 1 week duration. Prior to my evaluation, the patient has received a Fleets enema which has resulted in large bulky stool passed with complete resolution of symptoms.  ASSESSMENT:  A six week intrauterine pregnancy with pelvic pain secondary to severe constipation. The patient is started on prenatal vitamins, discharged from the emergency room at this time. Follow-up for routine prenatal visit in about two weeks time. Dictated by:   Langley Gauss, M.D. Attending Physician:  Annamarie Dawley DD:   06/27/01 TD:  06/28/01 Job: [id number]

## 2010-12-24 NOTE — Consult Note (Signed)
[facility]  Patient:    [name], PERRET Visit Number: [id number] MRN: [id number]          Service Type: EMS Location: ED Attending Physician:  Hilario Quarry Dictated by:   Duane Lope, M.D. Admit Date:  08/26/2001 Discharge Date: 08/26/2001                            Consultation Report  EMERGENCY ROOM NOTE  DATE OF BIRTH:  May 01, 1976  HISTORY OF PRESENT ILLNESS:  Ms. Harlon Flor is a 35 year old white female who presents complaining of vaginal bleeding and some uterine cramping during the day today.  The patient states that she is a patient of Langley Gauss, M.D., and she is about 3 months and [redacted] weeks pregnant.  She states that the spotting started last evening and she had some heavier bleeding and the cramping also was today.  She did have intercourse this morning.  The patient states that she has been seen in the office and had ultrasounds, which confirmed the dates that she gave me.  She also states that when she was seen in the office last week that there was some irregularity of the heartbeat and there was some question about fetal growth at that time.  PAST MEDICAL HISTORY:  Otherwise negative.  PAST SURGICAL HISTORY:  Negative as well.  PAST OBSTETRICAL HISTORY:  The patient states that during this pregnancy there was some question of whether it was a multiple pregnancy and that she had lost one or two of the other pregnancies and this is the one that remained.  She also stated that she had normal pregnancies prior to this one.  PHYSICAL EXAMINATION:  The abdomen is benign.  The pelvic exam reveals no blood in the vault at all, not even any old blood and certainly no pink or fresh blood.  The cervix is completely closed.  The pelvic reveals about a 14-week size uterine.  The cervix is long, thick, and closed.  VAGINAL SONOGRAM:  I was present while Erica, the sonogram tech, did the sonogram.  She had a pelvic sonogram which  revealed a singleton pregnancy in the transverse presentation.  Fetal heart rate about 155.  It was measuring 15 weeks and 2 days.  Pretty consistent measures throughout.  There was a little bit of hyperechoic area right behind the leading edge of the placenta which was probably consistent with blood, otherwise there were other abnormalities.  IMPRESSION:  Threatened early second trimester miscarriage.  PLAN:  The patient is given instructions to go home and be in bed.  Not to do any work at all and not to get around.  She is to drink plenty of fluids.  She has an appointment with Langley Gauss, M.D., tomorrow at 3:30 p.m. and she is instructed to keep that.  I will call Dr. Lisette Grinder prior to her visit and let him know of her status and her visit to the ER.  If she has any heavy bleeding or any other problems in the meantime, she is to call or come back to the emergency room. Dictated by:   Duane Lope, M.D. Attending Physician:  Hilario Quarry DD:  08/27/01 TD:  08/28/01 Job: [id number]

## 2010-12-24 NOTE — Consult Note (Signed)
[facility]  Patient:    [name], [name] Visit Number: [id number] MRN: [id number]          Service Type: MED Location: 4A A419 01 Attending Physician:  Jeri Cos. Dictated by:   Kari Baars, M.D. Admit Date:  11/30/2001   CC:         Duane Lope, M.D.  Langley Gauss, M.D.   Consultation Report  REASON FOR CONSULTATION:  Respiratory distress.  HISTORY:  This is a 35 year old who is pregnant and who has a history of asthma in the past. She was admitted to the hospital with a presumptive diagnosis of pyelonephritis because of some flank pain and fever. She was noted to have a very high white blood cell count. Her urine culture, however, was negative, and urinalysis turned out to be essentially negative as well. Since then she has developed increasing problems of shortness of breath and has what appears to be a right lower lobe infiltrate. She continues to be short of breath, has had some tachycardia, and has had poor oxygenation and a consultation was requested for help with management of her medical problems. She has also become increasingly anemic since she has been admitted. A lot of this was felt to be related to her pregnancy, but it has gotten worse.  PAST MEDICAL HISTORY:  Positive for asthma, but she says her asthma has been doing well through her pregnancy thus far.  SOCIAL HISTORY:  She smokes about a package of cigarettes daily. She is married, lives at home with her husband. She does not drink any alcohol.  FAMILY HISTORY:  Positive for asthma, unknown history of any sort of clotting disorders.  PHYSICAL EXAMINATION:  GENERAL:  Well-developed, dyspneic-appearing female who is in moderate distress.  VITAL SIGNS:  She has a blood pressure of about 100/70, her pulse is 110 and regular, she has had fever in the hospital, she does not have any this morning.  HEENT:  Her mucous membranes are slightly dry. Her pupils are  reactive. Nose and throat are clear.  CHEST:  Fairly marked wheezes bilaterally, and she sounds "tight."  HEART:  Regular but with a tachycardia.  ABDOMEN:  Soft.  EXTREMITIES:  No edema now.  Her white blood count has been as high as 30,000 with marked band forms. As mentioned, she has developed increasing anemia.  Chest x-ray shows probable pneumonia. Although, the official reading is of bronchitic changes.  ASSESSMENT:  She has asthma, probably pneumonia. Obviously an infectious process. In considering her hypoxemia and tachycardia, I think we should go ahead and rule out pulmonary embolism.  PLAN:  My plan then would be to have her get CT of the chest. I am going to go ahead and add steroids to her treatment. I agree with the use of Rocephin and Zithromax. Dictated by:   Kari Baars, M.D. Attending Physician:  Jeri Cos. DD:  12/03/01 TD:  12/03/01 Job: [id number]

## 2010-12-24 NOTE — Discharge Summary (Signed)
[facility]  Patient:    [name], [name] Visit Number: [id number] MRN: [id number]          Service Type: MED Location: 4A A428 01 Attending Physician:  Jeri Cos. Dictated by:   Langley Gauss, M.D. Admit Date:  02/06/2002 Discharge Date: 02/08/2002                             Discharge Summary  DIAGNOSIS:  Intrauterine pregnancy at 39 weeks, for induction of labor.  DELIVERY FORM:  Spontaneously assisted vaginal delivery of 6 pound 11 ounce female infant, delivery over intact perineum.  OTHER PROCEDURES PERFORMED:  Placement of continuous lumbar epidural analgesia.  DISPOSITION:  The patient is given a copy of this narrative discharge and instructions at the time of discharge.  Pertinently, during the prenatal course the patient had stated desire for use of oral contraceptives for birth control purposes.  During the prenatal course likewise the patient had been made aware that infant circumcision was a noncovered service by the [facility] public assistance program.  PERTINENT LABORATORY DATA:  Admission hemoglobin and hematocrit 10.5/29.7, with WBC of 12.4.  O-positive blood type.  HOSPITAL COURSE:  See previous dictations.  The patient actually delivered on February 07, 2002 in the early a.m.  If no postpartum problems arise the patient is to be discharged home on February 08, 2002 in my absence by Dr. Christin Bach. Dictated by:   Langley Gauss, M.D. Attending Physician:  Jeri Cos. DD:  02/07/02 TD:  02/11/02 Job: [id number]

## 2010-12-24 NOTE — Op Note (Signed)
[facility]  Patient:    [name], [name] Visit Number: [id number] MRN: [id number]          Service Type: MED Location: 4A A428 01 Attending Physician:  Jeri Cos. Dictated by:   Langley Gauss, M.D. Proc. Date: 02/07/02 Admit Date:  02/06/2002 Discharge Date: 02/08/2002                             Operative Report  DELIVERY NOTE  DIAGNOSES: 1. Intrauterine pregnancy at 39 weeks. 2. Induction of labor. 3. Delivery via spontaneous vaginal delivery, 6 pound 11 ounce female    infant delivered over an intact perineum.  SURGEON:  Langley Gauss, M.D.  ANESTHESIA FOR DELIVERY:  Continuous lumbar epidural.  COMPLICATIONS:  None.  SPECIMENS:  Arterial cord gas and cord blood to pathology.  The placenta was examined and noted to be apparently intact with a three vessel umbilical cord.  SUMMARY:  The patient was admitted for induction of labor.  Initial exam was 3 cm dilated.  Amniotomy was performed with findings of clear amniotic fluid. Fetal scalp electrode documented a reassuring fetal heart rate throughout the course of labor.  Pitocin induction was initiated after artificially rupturing membranes was performed.  The patient requested epidural analgesic for onset of uncomfortable uterine contractions.  The patient thereafter progressed rapidly along the labor curve with an excellent bilateral block in place. With the descent of the vertex to the peroneal floor, the patient was placed in the dorsal lithotomy position, prepped and draped in the usual sterile manner.  She pushed very well during a second short stage of labor to deliver in a direct OA position over the intact perineum.  Mouth and nares of the infant were bulb suctioned of clear amniotic fluid.  Renewed expulsive efforts resulted in a spontaneous rotation to a left anterior shoulder position. General down retraction combined with expulsive efforts resulted in delivery of the  anterior shoulder and the remainder of the infant without difficulty. The umbilical cord was then milked toward the infant.  The cord was doubly clamped and cut and the infant was placed on the maternal abdomen for immediate bonding purposes.  The arterial cord gas and cord blood were then obtained from the umbilical cord.  Gentle traction of the umbilical resulted in separation which upon examination appears to be an intact placenta with three vessel umbilical cord.  Bimanual massage of the uterus was required to achieve good uterine tone at this time.  Examination reveals intact perineum, no lacerations had occurred.  Thus, the patient was taken out of the dorsal lithotomy position, rolled to her side at which time the epidural catheter was removed with the balloon tip noted to be intact. Dictated by:   Langley Gauss, M.D. Attending Physician:  Jeri Cos. DD:  02/07/02 TD:  02/11/02 Job: [id number]

## 2010-12-24 NOTE — Procedures (Signed)
EEG NUMBER:  [id number]   HISTORY:  This is a 35 year old patient with possible stroke versus  seizures.  The patient being evaluated for the above.   EEG CLASSIFICATION:  Dysrhythmia, grade 1, generalized.   DESCRIPTION OF RECORDING:  The background rhythm in this recording  consists of a symmetric 6- to 7-Hz background slowing that is seen  throughout the recording.  The patient appears to be sedated during the  study with rudimentary sleep spindles and vertex sharp wave activity  seen at times.  The patient likely spends the majority of the recording  in the sleeping state.  Photic stimulation is performed, resulting in  bilateral and symmetric photic driving response.  Hyperventilation was  not performed.  At no time during the recording does there appear to be  evidence of spikes or spike wave discharges or evidence of focal  slowing.  EKG monitor shows no evidence of cardiac rhythm abnormalities  with a heart rate of 56.   IMPRESSION:  This is an abnormal EEG recording due to generalized  slowing.  Much of the recording appears to show changes consistent with  sleep.  No epileptiform discharges are seen.  There may be an underlying  toxic metabolic encephalopathy.  Clinical correlation is required.      Marlan Palau, M.D.  Electronically Signed     [id number]  D:  11/23/2006 17:13:33  T:  11/24/2006 10:06:53  Job #:  [id number]

## 2010-12-24 NOTE — H&P (Signed)
[facility]  Patient:    [name], [name] Visit Number: [id number] MRN: [id number]          Service Type: MED Location: 4A A428 01 Attending Physician:  Jeri Cos. Dictated by:   Langley Gauss, M.D. Admit Date:  02/06/2002 Discharge Date: 02/08/2002                           History and Physical  HISTORY OF PRESENT ILLNESS:  This is a 35 year old gravida 3, para 2 at [redacted] weeks gestation who is admitted for induction of labor.  The patient was seen in the office today for a scheduled visit at which time she was noted to be 3 cm dilated and was thus referred to [facility] for induction of labor.  The patients prenatal course was uncomplicated.  She was noted to have serial ultrasounds which have documented a normal anatomic survey and appropriate fetal growth.  AFP triple screen is within normal limits.  ALLERGIES:  She states she is allergic to PENICILLIN and SULFA, which gives her a rash.  She also describes an allergy to CODEINE.  OBSTETRICAL HISTORY:  September 1996, vaginal delivery of 6-pound 2-ounce infant.  June 18, 1999, vaginal delivery in [place] of a 7-pound 1-ounce infant.  Both labor and deliveries without complications.  PAST MEDICAL HISTORY:  She has no other medical or surgical history.  SOCIAL HISTORY:  The patient did smoke 1-1/2 packs per day prior to the pregnancy.  She did cut down to less than one pack per day during the pregnancy.  PHYSICAL EXAMINATION:  GENERAL:  Healthy, alert white female.  VITAL SIGNS:  Height 4 feet 11 inches, prepregnancy weight 90 pounds, todays weight is 112.  Blood pressure 110/77, pulse rate of 80, respiratory rate 20.  HEENT:  Negative.  There is no adenopathy.  NECK:  Supple.  Thyroid nonpalpable.  LUNGS:  Clear.  CARDIOVASCULAR:  Regular rate and rhythm.  ABDOMEN:  Soft and nontender.  No surgical scars are identified.  She is vertex presentation by [employer].  EXTREMITIES:  Normal, with only a trace of pretibial edema.  PELVIC:  Normal external genitalia.  No lesions or ulcerations identified.  No vaginal bleeding.  No leakage of fluid.  Cervix 3 cm dilated, 80% effaced, vertex at 0 station.  External fetal monitor reveals reassuring fetal heart rate with the baseline of 150s and fetal heart rate accelerations noted.  No fetal heart rate decelerations are noted.  Normal long-term variability. External ______  reveals minimal uterine activity identified.  ASSESSMENT AND PLAN:  Term intrauterine pregnancy with very variable cervix in this gravida 3 para 2.  The patient does plan on trying to breast feed.  She will be utilizing pediatrician on call and would like to utilize oral contraceptives following delivery.  The patient is admitted at this time. Will proceed with amniotomy and, thereafter, initiate Pitocin as clinically indicated for induction of labor.  Pelvis is noted to be clinically adequate. Dictated by:   Langley Gauss, M.D. Attending Physician:  Jeri Cos. DD:  02/07/02 TD:  02/11/02 Job: [id number]

## 2010-12-24 NOTE — Op Note (Signed)
[facility]  Patient:    [name], [name] Visit Number: [id number] MRN: [id number]          Service Type: MED Location: 4A A428 01 Attending Physician:  Jeri Cos. Dictated by:   Langley Gauss, M.D. Proc. Date: 02/06/02 Admit Date:  02/06/2002 Discharge Date: 02/08/2002                             Operative Report  PROCEDURE PERFORMED:  Placement of continuous lumbar epidural analgesia at the L3-L4 interspace, performed by Dr. Roylene Reason. Lisette Grinder.  COMPLICATIONS:  None.  SPECIMENS:  None.  SUMMARY:  Appropriate informed consent was obtained.  The patient has had an epidural placed with one prior labor and delivery.  The patient was placed in a seated position, at which time bony landmarks were identified.  Continuous electronic fetal monitoring was performed.  The L3-L4 interspace was chosen. The patients back was sterilely prepped and draped in the usual manner, utilizing the epidural kit, 5 cc of 1% lidocaine injected at the midline of the L3-L4 interspace to raise a small skin wheal.  The 17-gauge Tuohy-Schliff needle was utilized with loss of resistance and ______ to atraumatically identify entry into the epidural space on the first attempt without difficulty.  Excellent loss of resistance was noted.  The initial test dose, 5 cc of 1.5% lidocaine plus epinephrine injected through the epidural needle. No signs of CSF or intravascular injection obtained; thus, the epidural catheter was inserted to a depth of 4 cm.  Epidural needle was removed.  Aspiration test was negative.  The second test dose of 2 cc of 1.5% lidocaine with epinephrine injected to the epidural catheter.  Again, no signs of CSF or intravascular injection obtained; thus, the catheter was secured into place. The patient is having tingling in the feet, consistent with proper setting of epidural block.  The patient was then returned to the left lateral supine position.  She  was connected to the pump, containing the standard mixture. She will be treated with a bolus of 10 cc, followed by a continuous infusion rate of 12 cc per hour.  Following placement, a Foley catheter is to be placed.  The patient is again re-examined and noted to still be 3 cm dilated. Fetal heart rate remains reassuring.  Contractions are occurring q. 3 minutes. Dictated by:   Langley Gauss, M.D. Attending Physician:  Jeri Cos. DD:  02/07/02 TD:  02/11/02 Job: [id number]
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
## 2010-12-24 NOTE — Op Note (Signed)
   NAME:  [name], [name]                        ACCOUNT NO.:  [id number]   MEDICAL RECORD NO.:  [id number]                   PATIENT TYPE:  OBV   LOCATION:  A417                                 FACILITY:  [facility]   PHYSICIAN:  Tilda Burrow, M.D.              DATE OF BIRTH:  February 10, 1976   DATE OF PROCEDURE:  02/17/2003  DATE OF DISCHARGE:  02/17/2003                                 OPERATIVE REPORT   PREOPERATIVE DIAGNOSIS:  Suspected incomplete abortion.   POSTOPERATIVE DIAGNOSIS:  Suspected incomplete abortion.   PROCEDURE:  Suction D&C.   SURGEON:  Tilda Burrow, M.D.   INDICATIONS:  This 35 year old female gravida 4, para 3 admitted via the ER  with bleeding with positive home pregnancy test 4 weeks ago, still with  residual positive with spontaneous AB occurring 3 days ago at [facility]  and assessed through [facility] facility with no records available at  this time. There were no ultrasounds done.  She continued to bleed and cramp  and presents to our facility for resolution of symptoms.   HOSPITAL COURSE:  The patient was admitted with quantitative hCG of 700,  hemoglobin 11, and hematocrit 32, blood type O positive.  She was examined  and cervical os was closed with some bloody debris present.  She was taken  to the OR where suction D&C was performed obtaining scanty tissue fragments.  These were subsequently returned showing predominately secretory phase  endometrium with focal hypersecretory changes.  This was felt consistent  with decidua. It may be that only her only residual tissue was deciduous  tissue post spontaneous AB.   The patient was allowed to awaken after the procedure.  Went to recovery in  good condition and was discharged home on doxycycline and analgesics.                                               Tilda Burrow, M.D.    JVF/MEDQ  D:  03/09/2003  T:  03/10/2003  Job:  [id number]

## 2010-12-24 NOTE — H&P (Signed)
[name], [name]              ACCOUNT NO.:  [id number]   MEDICAL RECORD NO.:  [id number]          PATIENT TYPE:  EMS   LOCATION:  ED                            FACILITY:  [facility]   PHYSICIAN:  Osvaldo Shipper, MD     DATE OF BIRTH:  [birth year]   DATE OF ADMISSION:  04/30/2005  DATE OF DISCHARGE:  LH                                HISTORY & PHYSICAL   Patient does not have a primary medical doctor.   ADMITTING DIAGNOSES:  1.  Left-sided weakness, likely cerebrovascular accident.  2.  Insulin-dependent diabetes.  3.  Asthma.   CHIEF COMPLAINT:  Left-sided weakness.   HISTORY OF PRESENT ILLNESS:  Patient is a 35 year old Caucasian female with  a past history of type 1 diabetes, asthma, who presented to the ED today  with left-sided weakness which started at about 1:00 p.m. today when she  woke up.  According to the patient, she was fine until yesterday at about  6:00 p.m., when she got up from her porch, went inside and then had a  syncopal episode.  Patient did not have any prodromal symptoms.  There was  no chest pain, shortness of breath, an aura, any headaches prior to that  episode.  According to the patient and her boyfriend, she was unconscious  for about two to three minutes.  She woke up without any focal weakness or  deficits.  The rest of the evening and the night was uneventful.  Patient  woke up this morning and experienced some pain in her left arm and left leg.  She subsequently went back to sleep and at 1:00 p.m. when she woke up, she  noticed that her entire left side of her body, including her face, was numb  and she could not move her left arm or left leg.   At no point did patient have any chest pain or shortness of breath.  She  does not give history of any trauma apart from that fall that she  experienced last night.  She does complain of a headache at this time which  is described as a dull, pressure kind of sensation in the top of her head.  Patient did  not experience any seizure episode in the past few days.  There  is no history of any fever or chills.  She has not traveled outside this  region.  There have been no sick contacts.  She did mention that she went to  a zoo last week.  However, does not recall being bitten by any insect at the  time.   Patient does not recall having similar episodes in the past.   HOME MEDICATIONS:  1.  Insulin NPH 10 units in the morning, 10 units at night time.  However,      she mentions that she has not been taking her insulin for two weeks      because she does not have the medications.  2.  Insulin regular 10 units b.i.d.  3.  Albuterol nebulizers as needed.  4.  She also takes a multivitamin.   ALLERGIES:  CODEINE which causes itching.  PENICILLIN causes anaphylaxis.  SULFA causes rash.   PAST MEDICAL HISTORY:  1.  Type 1 diabetes for about 10 years.  2.  Asthma for about 8 years.  3.  She has had appendectomy in the past.  4.  Tubal ligation in the past.  5.  She had a boil removed from her right axilla about two weeks ago by Dr.      Malvin Johns.   Patient lives in [place] with her boyfriend.  She is currently  unemployed.  Smokes about half pack of cigarettes per day.  Has a 5 to 6-  pack-year history of smoking.  Drinks a beer occasionally.  No history of  any illicit drug use.   FAMILY HISTORY:  Mother has hypertension.  Her brother has hypertension.  There is no history of strokes in the family.  However, one of her brothers  was diagnosed with lupus at the age of  3.  One of her siblings was  diagnosed with lung cancer at the age of 74.  One of her uncles diagnosed  with lung cancer at the age of 24.   REVIEW OF SYSTEMS:  A 10-point review of systems was done which was  unremarkable, except as mentioned in the HPI.   PHYSICAL EXAMINATION:  VITAL SIGNS:  Temperature is 98.5, blood pressure  107/72, heart rate initially 111 and currently 84, respiratory rate 16,  saturating 99%  on room air.  GENERAL:  Thin, young female in no apparent distress.  HEENT:  There is no pallor, no icterus.  Oral mucous membranes moist.  No  oral lesions are seen.  LUNGS:  Clear to auscultation bilaterally on exam.  CARDIOVASCULAR:  S1, S2 are normal.  Regular.  No murmurs appreciated.  No  S3, S4.  No carotid bruits are heard.  ABDOMEN:  Soft, nontender, nondistended.  Bowel sounds present.  No mass or  organomegaly appreciated.  EXTREMITIES:  Without edema.  Peripheral pulses are palpable.  NEUROLOGIC:  Patient is alert, oriented x 3.  Cranial nerve examination  reveals droop on the left side of the face.  Shoulder shrug is impaired on  the left side.  Tongue is midline.  Motor examination reveals 5/5 strength  in upper and lower extremities on the right side.  Patient has 2 to 3/5  strength in the left upper and left lower extremities.  Patient appears to  have more strength distally in both her upper and lower extremities.  Tone  was equal bilaterally, upper and lower extremities.  Reflexes were, once  again, equal.  Sensory exam:  Patient with complete deficits in the left  upper and lower extremities to soft touch as well as to pinprick.  Sensations are normal on the right side.  Cerebellar signs, again, were  completely unable to be done on the left side.  Gait was not assessed.  Patient was not stood up.  Plantars were equivocal bilaterally.  Pronator  drift could not be assessed.   LABORATORY DATA:  CBC was completely unremarkable.  PT/INR normal.  CMP  completely unremarkable.  Urine tox screen positive for THC and opiates.  UA  was negative for infection, protein or blood.   CT of the head was done which was negative for any acute abnormalities.   EKG was done which showed sinus rhythm with normal axis, non-significant Q-  waves seen in inferior leads.  All intervals appeared to be within normal limits.  No ST/T changes  were seen, otherwise.   IMPRESSION:  This is a  35 year old Caucasian female with a past history of  type 1 diabetes, asthma, who presents with left-sided weakness, onset  sometime between this morning and 1:00 p.m.  Patient appears to have  regained some strength on the left side since she has been in the emergency  department.  Initially, she was not able to move her fingers at all.  Currently, she is able to do so.  Patient is very young to have had a  cerebrovascular accident.  We have to rule out autoimmune processes.  Patient's CAT scan was negative for any acute event at this time.   PLAN:  We will obtain an MRI/MRA of the brain and neck to further assess her  condition.  Patient will be started on aspirin.  We will check homocystine,  ANA, ESR and CRP.  A lipid profile will be checked in the morning.  Consultation will be obtained from Dr. Gerilyn Pilgrim.  Echocardiogram and carotid  Dopplers will also be done.  Physical therapy will be involved in the  patient's care.   Because the patient has not been taking her insulin and since her glucose is  normal at this time, I will not re-institute NPH.  I will put the patient on  sliding scale and monitor CBG q.a.c. and h.s. at this time.   Further management decision will be based on results of initial testing and  patient's response to treatment.      Osvaldo Shipper, MD  Electronically Signed     GK/MEDQ  D:  04/30/2005  T:  04/30/2005  Job:  [id number]   cc:   Darleen Crocker A. Gerilyn Pilgrim, M.D.  Fax: [phone]

## 2010-12-24 NOTE — Group Therapy Note (Signed)
[facility]  Patient:    [name], [name] Visit Number: [id number] MRN: [id number]          Service Type: MED Location: 4A A419 01 Attending Physician:  Jeri Cos. Dictated by:   Kari Baars, M.D. Admit Date:  11/30/2001                               Progress Note  SUBJECTIVE:  [name] says she is feeling much better and feels like she could probably go home.  She says she has discussed this with Dr. Lisette Grinder and he has told her that she could be discharged.  I have gone ahead and written for her to have her nebulizer treatments at home.  She is going to need prednisone at home also.  I do not plan any other new treatments right now.  I will plan to follow her up as an outpatient, assuming that she does get to go home.  PHYSICAL EXAMINATION  CHEST:  Much clearer than it has been.  HEART:  Regular.  ABDOMEN:  Soft.  ASSESSMENT:  She is much improved.  PLAN:  Continue treatments and medicines.  No changes. Dictated by:   Kari Baars, M.D. Attending Physician:  Jeri Cos. DD:  12/05/01 TD:  12/06/01 Job: [id number]

## 2011-08-19 ENCOUNTER — Other Ambulatory Visit (HOSPITAL_COMMUNITY): Payer: Self-pay | Admitting: Family Medicine

## 2011-08-19 ENCOUNTER — Ambulatory Visit (HOSPITAL_COMMUNITY)
Admission: RE | Admit: 2011-08-19 | Discharge: 2011-08-19 | Disposition: A | Discharge status: Home / Self Care | Payer: Medicaid Other | Source: Ambulatory Visit | Attending: Family Medicine | Admitting: Family Medicine

## 2011-08-19 DIAGNOSIS — M545 Low back pain, unspecified: Secondary | ICD-10-CM | POA: Insufficient documentation

## 2011-08-19 DIAGNOSIS — R52 Pain, unspecified: Secondary | ICD-10-CM

## 2011-08-19 DIAGNOSIS — R209 Unspecified disturbances of skin sensation: Secondary | ICD-10-CM | POA: Insufficient documentation

## 2011-08-19 DIAGNOSIS — M5137 Other intervertebral disc degeneration, lumbosacral region: Secondary | ICD-10-CM | POA: Insufficient documentation

## 2011-08-19 DIAGNOSIS — M79609 Pain in unspecified limb: Secondary | ICD-10-CM | POA: Insufficient documentation

## 2011-08-19 DIAGNOSIS — M51379 Other intervertebral disc degeneration, lumbosacral region without mention of lumbar back pain or lower extremity pain: Secondary | ICD-10-CM | POA: Insufficient documentation

## 2012-05-21 ENCOUNTER — Emergency Department (HOSPITAL_COMMUNITY)
Admission: EM | Admit: 2012-05-21 | Discharge: 2012-05-21 | Disposition: A | Discharge status: Home / Self Care | Payer: Medicaid Other | Attending: Emergency Medicine | Admitting: Emergency Medicine

## 2012-05-21 ENCOUNTER — Emergency Department (HOSPITAL_COMMUNITY): Payer: Medicaid Other

## 2012-05-21 ENCOUNTER — Encounter (HOSPITAL_COMMUNITY): Payer: Self-pay

## 2012-05-21 DIAGNOSIS — M542 Cervicalgia: Secondary | ICD-10-CM | POA: Insufficient documentation

## 2012-05-21 DIAGNOSIS — M549 Dorsalgia, unspecified: Secondary | ICD-10-CM | POA: Insufficient documentation

## 2012-05-21 DIAGNOSIS — S161XXA Strain of muscle, fascia and tendon at neck level, initial encounter: Secondary | ICD-10-CM

## 2012-05-21 DIAGNOSIS — S1093XA Contusion of unspecified part of neck, initial encounter: Secondary | ICD-10-CM | POA: Insufficient documentation

## 2012-05-21 DIAGNOSIS — S139XXA Sprain of joints and ligaments of unspecified parts of neck, initial encounter: Secondary | ICD-10-CM | POA: Insufficient documentation

## 2012-05-21 DIAGNOSIS — Z8673 Personal history of transient ischemic attack (TIA), and cerebral infarction without residual deficits: Secondary | ICD-10-CM | POA: Insufficient documentation

## 2012-05-21 DIAGNOSIS — S0003XA Contusion of scalp, initial encounter: Secondary | ICD-10-CM | POA: Insufficient documentation

## 2012-05-21 DIAGNOSIS — Z79899 Other long term (current) drug therapy: Secondary | ICD-10-CM | POA: Insufficient documentation

## 2012-05-21 DIAGNOSIS — S0093XA Contusion of unspecified part of head, initial encounter: Secondary | ICD-10-CM

## 2012-05-21 DIAGNOSIS — G35 Multiple sclerosis: Secondary | ICD-10-CM | POA: Insufficient documentation

## 2012-05-21 DIAGNOSIS — Y92009 Unspecified place in unspecified non-institutional (private) residence as the place of occurrence of the external cause: Secondary | ICD-10-CM | POA: Insufficient documentation

## 2012-05-21 DIAGNOSIS — IMO0002 Reserved for concepts with insufficient information to code with codable children: Secondary | ICD-10-CM | POA: Insufficient documentation

## 2012-05-21 HISTORY — DX: Anxiety disorder, unspecified: F41.9

## 2012-05-21 HISTORY — DX: Multiple sclerosis: G35

## 2012-05-21 HISTORY — DX: Cerebral infarction, unspecified: I63.9

## 2012-05-21 HISTORY — DX: Reserved for concepts with insufficient information to code with codable children: IMO0002

## 2012-05-21 MED ORDER — OXYCODONE-ACETAMINOPHEN 5-325 MG PO TABS
1.0000 | ORAL_TABLET | Freq: Once | ORAL | Status: AC
  Administered 2012-05-21: 1 via ORAL
  Filled 2012-05-21: qty 1

## 2012-05-21 MED ORDER — OXYCODONE-ACETAMINOPHEN 5-325 MG PO TABS: 1.0000 | ORAL_TABLET | Freq: Four times a day (QID) | ORAL | Status: DC | PRN

## 2012-05-21 MED ORDER — CYCLOBENZAPRINE HCL 10 MG PO TABS: 10.0000 mg | ORAL_TABLET | Freq: Two times a day (BID) | ORAL | Status: DC | PRN
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
## 2012-05-21 NOTE — ED Notes (Signed)
c-collar placed in triage due to complaints of neck pain.
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
## 2012-08-05 ENCOUNTER — Other Ambulatory Visit (HOSPITAL_COMMUNITY): Payer: Self-pay | Admitting: Emergency Medicine

## 2012-08-05 ENCOUNTER — Emergency Department (HOSPITAL_COMMUNITY): Payer: Medicaid Other

## 2012-08-05 ENCOUNTER — Encounter (HOSPITAL_COMMUNITY): Payer: Self-pay

## 2012-08-05 ENCOUNTER — Ambulatory Visit (HOSPITAL_COMMUNITY)
Admit: 2012-08-05 | Discharge: 2012-08-05 | Disposition: A | Discharge status: Home / Self Care | Payer: Medicaid Other | Attending: Emergency Medicine | Admitting: Emergency Medicine

## 2012-08-05 ENCOUNTER — Emergency Department (HOSPITAL_COMMUNITY)
Admission: EM | Admit: 2012-08-05 | Discharge: 2012-08-05 | Disposition: A | Discharge status: Home / Self Care | Payer: Medicaid Other | Attending: Emergency Medicine | Admitting: Emergency Medicine

## 2012-08-05 DIAGNOSIS — Z79899 Other long term (current) drug therapy: Secondary | ICD-10-CM | POA: Insufficient documentation

## 2012-08-05 DIAGNOSIS — Z87891 Personal history of nicotine dependence: Secondary | ICD-10-CM | POA: Insufficient documentation

## 2012-08-05 DIAGNOSIS — N83202 Unspecified ovarian cyst, left side: Secondary | ICD-10-CM

## 2012-08-05 DIAGNOSIS — Z8659 Personal history of other mental and behavioral disorders: Secondary | ICD-10-CM | POA: Insufficient documentation

## 2012-08-05 DIAGNOSIS — G35 Multiple sclerosis: Secondary | ICD-10-CM | POA: Insufficient documentation

## 2012-08-05 DIAGNOSIS — R102 Pelvic and perineal pain: Secondary | ICD-10-CM

## 2012-08-05 DIAGNOSIS — Z8673 Personal history of transient ischemic attack (TIA), and cerebral infarction without residual deficits: Secondary | ICD-10-CM | POA: Insufficient documentation

## 2012-08-05 DIAGNOSIS — N949 Unspecified condition associated with female genital organs and menstrual cycle: Secondary | ICD-10-CM | POA: Insufficient documentation

## 2012-08-05 DIAGNOSIS — Z7982 Long term (current) use of aspirin: Secondary | ICD-10-CM | POA: Insufficient documentation

## 2012-08-05 DIAGNOSIS — N83209 Unspecified ovarian cyst, unspecified side: Secondary | ICD-10-CM | POA: Insufficient documentation

## 2012-08-05 LAB — WET PREP, GENITAL
Trich, Wet Prep: NONE SEEN
WBC, Wet Prep HPF POC: NONE SEEN
Yeast Wet Prep HPF POC: NONE SEEN

## 2012-08-05 LAB — URINALYSIS, ROUTINE W REFLEX MICROSCOPIC
Bilirubin Urine: NEGATIVE
Glucose, UA: NEGATIVE mg/dL
Hgb urine dipstick: NEGATIVE
Ketones, ur: NEGATIVE mg/dL
Leukocytes, UA: NEGATIVE
Nitrite: NEGATIVE
Protein, ur: NEGATIVE mg/dL
Specific Gravity, Urine: 1.02 (ref 1.005–1.030)
Urobilinogen, UA: 0.2 mg/dL (ref 0.0–1.0)
pH: 6 (ref 5.0–8.0)

## 2012-08-05 LAB — PREGNANCY, URINE: Preg Test, Ur: NEGATIVE

## 2012-08-05 MED ORDER — METRONIDAZOLE 500 MG PO TABS: 500.0000 mg | ORAL_TABLET | Freq: Two times a day (BID) | ORAL | Status: DC

## 2012-08-05 MED ORDER — ONDANSETRON HCL 4 MG/2ML IJ SOLN
4.0000 mg | Freq: Once | INTRAMUSCULAR | Status: AC
  Administered 2012-08-05: 4 mg via INTRAVENOUS
  Filled 2012-08-05: qty 2

## 2012-08-05 MED ORDER — OXYCODONE-ACETAMINOPHEN 5-325 MG PO TABS: 2.0000 | ORAL_TABLET | ORAL | Status: DC | PRN

## 2012-08-05 MED ORDER — KETOROLAC TROMETHAMINE 30 MG/ML IJ SOLN
30.0000 mg | Freq: Once | INTRAMUSCULAR | Status: AC
  Administered 2012-08-05: 30 mg via INTRAVENOUS
  Filled 2012-08-05: qty 1

## 2012-08-05 MED ORDER — METRONIDAZOLE 500 MG PO TABS
500.0000 mg | ORAL_TABLET | Freq: Once | ORAL | Status: AC
  Administered 2012-08-05: 500 mg via ORAL
  Filled 2012-08-05: qty 1

## 2012-08-05 MED ORDER — HYDROMORPHONE HCL PF 1 MG/ML IJ SOLN
1.0000 mg | Freq: Once | INTRAMUSCULAR | Status: AC
  Administered 2012-08-05: 1 mg via INTRAVENOUS
  Filled 2012-08-05: qty 1

## 2012-08-05 NOTE — ED Notes (Addendum)
Pt states lower left abd pain and pelvic pain starting Monday. States that she feels like a knot is in her abdomen. Denies urinary symptoms. LLQ tender to palpation

## 2012-08-05 NOTE — ED Provider Notes (Signed)
Patient returned for pelvic ultrasound which showed a complex ovarian cyst which radiology recommends repeat ultrasound in several weeks, the patient is aware and will followup with her doctor to arrange.  Hurman Horn, MD 08/05/12 [phone]

## 2012-08-05 NOTE — ED Provider Notes (Signed)
History     CSN: [id number]  Arrival date & time 08/05/12  0056   First MD Initiated Contact with Patient 08/05/12 0128      Chief Complaint  Patient presents with  . Abdominal Pain  . Pelvic Pain    (Consider location/radiation/quality/duration/timing/severity/associated sxs/prior treatment) HPI History provided by patient. Left-sided pelvic pain radiates from left lower quadrant. Sharp in quality and onset 2 days ago worse tonight. No history of same. LMP 3 weeks ago. Denies any vaginal discharge or bleeding. Does have a knot in her left pelvic region that she noticed with pain. No nausea vomiting or diarrhea. No dysuria, urgency or frequency. No rash. No fevers. Moderate in severity. Past Medical History  Diagnosis Date  . MS (multiple sclerosis)   . Stroke   . Anxiety   . Bulging disc     Past Surgical History  Procedure Date  . Tubal ligation   . Wisdonm teeth     No family history on file.  History  Substance Use Topics  . Smoking status: Former Smoker    Types: Cigarettes  . Smokeless tobacco: Not on file  . Alcohol Use: No    OB History    Grav Para Term Preterm Abortions TAB SAB Ect Mult Living                  Review of Systems  Constitutional: Negative for fever and chills.  HENT: Negative for neck pain and neck stiffness.   Eyes: Negative for pain.  Respiratory: Negative for shortness of breath.   Cardiovascular: Negative for chest pain.  Gastrointestinal: Negative for vomiting and abdominal distention.  Genitourinary: Positive for pelvic pain. Negative for dysuria.  Musculoskeletal: Negative for back pain.  Skin: Negative for rash.  Neurological: Negative for headaches.  All other systems reviewed and are negative.    Allergies  Codeine; Penicillins; and Sulfa antibiotics  Home Medications   Current Outpatient Rx  Name  Route  Sig  Dispense  Refill  . ALBUTEROL SULFATE HFA 108 (90 BASE) MCG/ACT IN AERS   Inhalation   Inhale 2 puffs  into the lungs every 6 (six) hours as needed. Asthma.         . ALBUTEROL SULFATE (2.5 MG/3ML) 0.083% IN NEBU   Nebulization   Take 2.5 mg by nebulization every 6 (six) hours as needed. Asthma.         . ASPIRIN 81 MG PO CHEW   Oral   Chew 81 mg by mouth daily.         . CENTRUM ULTRA WOMENS PO TABS   Oral   Take 1 tablet by mouth daily.         . CYCLOBENZAPRINE HCL 10 MG PO TABS   Oral   Take 1 tablet (10 mg total) by mouth 2 (two) times daily as needed for muscle spasms.   20 tablet   0   . OXYCODONE-ACETAMINOPHEN 5-325 MG PO TABS   Oral   Take 1-2 tablets by mouth every 6 (six) hours as needed for pain.   15 tablet   0     BP 144/108  Pulse 95  Temp 98.1 F (36.7 C) (Oral)  Resp 22  Ht 4\' 11"  (1.499 m)  Wt 90 lb (40.824 kg)  BMI 18.18 kg/m2  SpO2 98%  LMP 07/15/2012  Physical Exam  Constitutional: She is oriented to person, place, and time. She appears well-developed and well-nourished.  HENT:  Head: Normocephalic and atraumatic.  Eyes: EOM are normal. Pupils are equal, round, and reactive to light.  Neck: Neck supple.  Cardiovascular: Regular rhythm and intact distal pulses.   Pulmonary/Chest: Effort normal. No respiratory distress.  Abdominal: Soft. Bowel sounds are normal. She exhibits no distension. There is no rebound and no guarding.       Left inguinal palpable lymph node mildly tender to palpation. There is some surrounding left-sided pelvic tenderness, no abdominal tenderness otherwise  Musculoskeletal: Normal range of motion. She exhibits no edema.  Neurological: She is alert and oriented to person, place, and time.  Skin: Skin is warm and dry.    ED Course  Pelvic exam Date/Time: 08/05/2012 1:40 AM Performed by: Sunnie Nielsen Authorized by: Sunnie Nielsen Consent: Verbal consent obtained. Risks and benefits: risks, benefits and alternatives were discussed Consent given by: patient Patient understanding: patient states understanding of  the procedure being performed Patient consent: the patient's understanding of the procedure matches consent given Procedure consent: procedure consent matches procedure scheduled Required items: required blood products, implants, devices, and special equipment available Patient identity confirmed: verbally with patient Time out: Immediately prior to procedure a "time out" was called to verify the correct patient, procedure, equipment, support staff and site/side marked as required. Preparation: Patient was prepped and draped in the usual sterile fashion. Patient tolerance: Patient tolerated the procedure well with no immediate complications. Comments: Normal external GU without rash or lesion. No cervical motion tenderness. Mild white discharge. Mild left adnexal tenderness. No blood   (including critical care time)  IV Dilaudid and Zofran provided. On recheck still having significant pain and repeat Dilaudid and oral provided.  UA, wet prep and CT scan reviewed. Pain much improved. Flagyl provided. Ultrasound ordered for 12 noon and Patient to be discharged home and return for exam. Description for Flagyl and Percocet provided. Return precautions verbalizes understood. Plan followup in 2 days for culture results  MDM   Pelvic pain with ovarian cyst on CT scan. Ultrasound ordered. UA reviewed. Improved with IV narcotics. Vital signs and nursing notes reviewed. Prescription for Flagyl provided with positive wet prep/ clue cells.        Sunnie Nielsen, MD 08/05/12 [phone]

## 2012-08-05 NOTE — ED Notes (Addendum)
Pt resting comfortably. Family in room

## 2012-08-05 NOTE — ED Notes (Signed)
Abdominal pain, left side, radiating in pelvis and private area per pt. Started on Monday per pt. I have a knot on my left side per pt.

## 2012-08-05 NOTE — ED Notes (Signed)
Pelvic cart at bedside. 

## 2012-08-06 LAB — GC/CHLAMYDIA PROBE AMP
CT Probe RNA: NEGATIVE
GC Probe RNA: NEGATIVE

## 2012-08-06 LAB — RPR: RPR Ser Ql: NONREACTIVE

## 2013-11-16 ENCOUNTER — Encounter (HOSPITAL_COMMUNITY): Payer: Self-pay | Admitting: Emergency Medicine

## 2013-11-16 DIAGNOSIS — Z8673 Personal history of transient ischemic attack (TIA), and cerebral infarction without residual deficits: Secondary | ICD-10-CM | POA: Insufficient documentation

## 2013-11-16 DIAGNOSIS — Y9389 Activity, other specified: Secondary | ICD-10-CM | POA: Insufficient documentation

## 2013-11-16 DIAGNOSIS — S20219A Contusion of unspecified front wall of thorax, initial encounter: Secondary | ICD-10-CM | POA: Insufficient documentation

## 2013-11-16 DIAGNOSIS — F411 Generalized anxiety disorder: Secondary | ICD-10-CM | POA: Insufficient documentation

## 2013-11-16 DIAGNOSIS — Z8669 Personal history of other diseases of the nervous system and sense organs: Secondary | ICD-10-CM | POA: Insufficient documentation

## 2013-11-16 DIAGNOSIS — Y92009 Unspecified place in unspecified non-institutional (private) residence as the place of occurrence of the external cause: Secondary | ICD-10-CM | POA: Insufficient documentation

## 2013-11-16 DIAGNOSIS — N390 Urinary tract infection, site not specified: Secondary | ICD-10-CM | POA: Insufficient documentation

## 2013-11-16 DIAGNOSIS — Z792 Long term (current) use of antibiotics: Secondary | ICD-10-CM | POA: Insufficient documentation

## 2013-11-16 DIAGNOSIS — Z7982 Long term (current) use of aspirin: Secondary | ICD-10-CM | POA: Insufficient documentation

## 2013-11-16 DIAGNOSIS — Z880 Allergy status to penicillin: Secondary | ICD-10-CM | POA: Insufficient documentation

## 2013-11-16 DIAGNOSIS — Z8739 Personal history of other diseases of the musculoskeletal system and connective tissue: Secondary | ICD-10-CM | POA: Insufficient documentation

## 2013-11-16 DIAGNOSIS — Z79899 Other long term (current) drug therapy: Secondary | ICD-10-CM | POA: Insufficient documentation

## 2013-11-16 DIAGNOSIS — F17200 Nicotine dependence, unspecified, uncomplicated: Secondary | ICD-10-CM | POA: Insufficient documentation

## 2013-11-16 DIAGNOSIS — W07XXXA Fall from chair, initial encounter: Secondary | ICD-10-CM | POA: Insufficient documentation

## 2013-11-16 NOTE — ED Notes (Addendum)
Pt reporting falling from chair and hitting back and right side.  Reporting pain in lower back, right side, radiating into hip. Pt reports being out of her percocet, flexeril and "nerve pill".

## 2013-11-17 ENCOUNTER — Emergency Department (HOSPITAL_COMMUNITY)
Admission: EM | Admit: 2013-11-17 | Discharge: 2013-11-17 | Disposition: A | Discharge status: Home / Self Care | Payer: Medicaid Other | Attending: Emergency Medicine | Admitting: Emergency Medicine

## 2013-11-17 ENCOUNTER — Emergency Department (HOSPITAL_COMMUNITY): Payer: Medicaid Other

## 2013-11-17 DIAGNOSIS — S20211A Contusion of right front wall of thorax, initial encounter: Secondary | ICD-10-CM

## 2013-11-17 DIAGNOSIS — N390 Urinary tract infection, site not specified: Secondary | ICD-10-CM

## 2013-11-17 DIAGNOSIS — Y92009 Unspecified place in unspecified non-institutional (private) residence as the place of occurrence of the external cause: Secondary | ICD-10-CM

## 2013-11-17 DIAGNOSIS — W19XXXA Unspecified fall, initial encounter: Secondary | ICD-10-CM

## 2013-11-17 LAB — URINALYSIS, ROUTINE W REFLEX MICROSCOPIC
Bilirubin Urine: NEGATIVE
Glucose, UA: NEGATIVE mg/dL
Hgb urine dipstick: NEGATIVE
Ketones, ur: NEGATIVE mg/dL
Nitrite: NEGATIVE
Protein, ur: NEGATIVE mg/dL
Specific Gravity, Urine: 1.015 (ref 1.005–1.030)
Urobilinogen, UA: 0.2 mg/dL (ref 0.0–1.0)
pH: 6.5 (ref 5.0–8.0)

## 2013-11-17 LAB — URINE MICROSCOPIC-ADD ON

## 2013-11-17 MED ORDER — OXYCODONE-ACETAMINOPHEN 5-325 MG PO TABS
1.0000 | ORAL_TABLET | ORAL | Status: DC | PRN
Start: 2013-11-17 — End: 2013-12-24

## 2013-11-17 MED ORDER — OXYCODONE-ACETAMINOPHEN 5-325 MG PO TABS
2.0000 | ORAL_TABLET | Freq: Once | ORAL | Status: AC
  Administered 2013-11-17: 2 via ORAL
  Filled 2013-11-17: qty 2

## 2013-11-17 MED ORDER — NITROFURANTOIN MONOHYD MACRO 100 MG PO CAPS: 100.0000 mg | ORAL_CAPSULE | Freq: Two times a day (BID) | ORAL | Status: DC

## 2013-11-17 NOTE — ED Provider Notes (Signed)
CSN: [id number]     Arrival date & time 11/16/13  2123 History   First MD Initiated Contact with Patient 11/17/13 0229     Chief Complaint  Patient presents with  . Fall  . Back Pain     (Consider location/radiation/quality/duration/timing/severity/associated sxs/prior Treatment) Patient is a 38 y.o. female presenting with fall and back pain. The history is provided by the patient.  Fall  Back Pain She was sitting in a chair when the chair fell and she struck her right rib cage. She is complaining of severe pain there and rates her pain a 10/10. Pain is now radiating into her lower back and into her right hip. She has not taken anything for pain. Pain is worse with movement and palpation. She denies other injury.  Past Medical History  Diagnosis Date  . MS (multiple sclerosis)   . Stroke   . Anxiety   . Bulging disc    Past Surgical History  Procedure Laterality Date  . Tubal ligation    . Wisdonm teeth     History reviewed. No pertinent family history. History  Substance Use Topics  . Smoking status: Current Every Day Smoker -- 0.50 packs/day    Types: Cigarettes  . Smokeless tobacco: Not on file  . Alcohol Use: No   OB History   Grav Para Term Preterm Abortions TAB SAB Ect Mult Living                 Review of Systems  Musculoskeletal: Positive for back pain.  All other systems reviewed and are negative.     Allergies  Codeine; Naproxen; Penicillins; and Sulfa antibiotics  Home Medications   Current Outpatient Rx  Name  Route  Sig  Dispense  Refill  . albuterol (PROVENTIL HFA;VENTOLIN HFA) 108 (90 BASE) MCG/ACT inhaler   Inhalation   Inhale 2 puffs into the lungs every 6 (six) hours as needed. Asthma.         Marland Kitchen. albuterol (PROVENTIL) (2.5 MG/3ML) 0.083% nebulizer solution   Nebulization   Take 2.5 mg by nebulization every 6 (six) hours as needed. Asthma.         Marland Kitchen. aspirin 81 MG chewable tablet   Oral   Chew 81 mg by mouth daily.         .  cyclobenzaprine (FLEXERIL) 10 MG tablet   Oral   Take 1 tablet (10 mg total) by mouth 2 (two) times daily as needed for muscle spasms.   20 tablet   0   . metroNIDAZOLE (FLAGYL) 500 MG tablet   Oral   Take 1 tablet (500 mg total) by mouth 2 (two) times daily.   14 tablet   0   . Multiple Vitamins-Minerals (CENTRUM ULTRA WOMENS) TABS   Oral   Take 1 tablet by mouth daily.         Marland Kitchen. oxyCODONE-acetaminophen (PERCOCET/ROXICET) 5-325 MG per tablet   Oral   Take 1-2 tablets by mouth every 6 (six) hours as needed for pain.   15 tablet   0   . oxyCODONE-acetaminophen (PERCOCET/ROXICET) 5-325 MG per tablet   Oral   Take 2 tablets by mouth every 4 (four) hours as needed for pain.   15 tablet   0    BP 146/122  Pulse 96  Temp(Src) 97.8 F (36.6 C) (Oral)  Resp 19  Ht 4\' 9"  (1.448 m)  Wt 90 lb (40.824 kg)  BMI 19.47 kg/m2  SpO2 100%  LMP 11/06/2013 Physical Exam  Nursing note and vitals reviewed.  38 year old female, resting comfortably and in no acute distress. Vital signs are significant for hypertension with blood pressure 146/122. Oxygen saturation is 100%, which is normal. Head is normocephalic and atraumatic. PERRLA, EOMI. Oropharynx is clear. Neck is nontender and supple without adenopathy or JVD. Back is  Moderately tender in the lower thoracic and mid and upper lumbar area. Lungs are clear without rales, wheezes, or rhonchi. Chest is very tender in the right side posteriorly. There is no point tenderness. Heart has regular rate and rhythm without murmur. Abdomen is soft, flat, nontender without masses or hepatosplenomegaly and peristalsis is normoactive. Extremities have no cyanosis or edema. Flexion contractures are present on the left arm and left leg. Skin is warm and dry without rash. Neurologic: Mental status is normal, cranial nerves are intact moderate left hemiparesis is present.  ED Course  Procedures (including critical care time) Labs Review Results  for orders placed during the hospital encounter of 11/17/13  URINALYSIS, ROUTINE W REFLEX MICROSCOPIC      Result Value Ref Range   Color, Urine YELLOW  YELLOW   APPearance CLEAR  CLEAR   Specific Gravity, Urine 1.015  1.005 - 1.030   pH 6.5  5.0 - 8.0   Glucose, UA NEGATIVE  NEGATIVE mg/dL   Hgb urine dipstick NEGATIVE  NEGATIVE   Bilirubin Urine NEGATIVE  NEGATIVE   Ketones, ur NEGATIVE  NEGATIVE mg/dL   Protein, ur NEGATIVE  NEGATIVE mg/dL   Urobilinogen, UA 0.2  0.0 - 1.0 mg/dL   Nitrite NEGATIVE  NEGATIVE   Leukocytes, UA TRACE (*) NEGATIVE  URINE MICROSCOPIC-ADD ON      Result Value Ref Range   Squamous Epithelial / LPF FEW (*) RARE   WBC, UA 3-6  <3 WBC/hpf   RBC / HPF 0-2  <3 RBC/hpf   Bacteria, UA MANY (*) RARE   Imaging Review Dg Chest 2 View  11/17/2013   CLINICAL DATA:  Right rib pain and low back pain after a fall.  EXAM: CHEST  2 VIEW  COMPARISON:  08/19/2011  FINDINGS: Mild hyperinflation. The heart size and mediastinal contours are within normal limits. Both lungs are clear. The visualized skeletal structures are unremarkable.  IMPRESSION: No active cardiopulmonary disease.   Electronically Signed   By: Burman Nieves M.D.   On: 11/17/2013 03:27   Dg Ribs Unilateral W/chest Right  11/17/2013   CLINICAL DATA:  Right rib pain and low back pain after fall on the floor.  EXAM: RIGHT RIBS AND CHEST - 3+ VIEW  COMPARISON:  Chest 11/17/2013  FINDINGS: No fracture or other bone lesions are seen involving the ribs. There is no evidence of pneumothorax or pleural effusion. Both lungs are clear. Heart size and mediastinal contours are within normal limits.  IMPRESSION: Negative.   Electronically Signed   By: Burman Nieves M.D.   On: 11/17/2013 03:35   Dg Lumbar Spine Complete  11/17/2013   CLINICAL DATA:  Right rib pain and low back pain after a fall.  EXAM: LUMBAR SPINE - COMPLETE 4+ VIEW  COMPARISON:  CT abdomen and pelvis 08/05/2012  FINDINGS: There is no evidence of lumbar  spine fracture. Alignment is normal. Intervertebral disc spaces are maintained. Surgical clips in the right lower quadrant.  IMPRESSION: Negative.   Electronically Signed   By: Burman Nieves M.D.   On: 11/17/2013 03:41    MDM   Final diagnoses:  Fall at home  Contusion of right  chest wall  UTI (urinary tract infection)    Fall with trauma to the right ribs and lumbar area. She'll be sent for x-rays to rule out fracture. She's given a dose of oxycodone and acetaminophen for pain. She is allergic to codeine but states that she does not have any reaction to oxycodone.  X-rays are negative for fracture. Urinalysis is significant for bacteria. It is noted that she has history of UTIs and this will be treated appropriately. She's given prescription for nitrofurantoin and is discharged with prescription for oxycodone and acetaminophen. She is to followup with her PCP in 10 days to make sure that her urine has cleared.  Dione Booze, MD 11/17/13 0430

## 2013-11-17 NOTE — Discharge Instructions (Signed)
Blunt Chest Trauma Blunt chest trauma is an injury caused by a blow to the chest. These chest injuries can be very painful. Blunt chest trauma often results in bruised or broken (fractured) ribs. Most cases of bruised and fractured ribs from blunt chest traumas get better after 1 to 3 weeks of rest and pain medicine. Often, the soft tissue in the chest wall is also injured, causing pain and bruising. Internal organs, such as the heart and lungs, may also be injured. Blunt chest trauma can lead to serious medical problems. This injury requires immediate medical care. CAUSES   Motor vehicle collisions.  Falls.  Physical violence.  Sports injuries. SYMPTOMS   Chest pain. The pain may be worse when you move or breathe deeply.  Shortness of breath.  Lightheadedness.  Bruising.  Tenderness.  Swelling. DIAGNOSIS  Your caregiver will do a physical exam. X-rays may be taken to look for fractures. However, minor rib fractures may not show up on X-rays until a few days after the injury. If a more serious injury is suspected, further imaging tests may be done. This may include ultrasounds, computed tomography (CT) scans, or magnetic resonance imaging (MRI). TREATMENT  Treatment depends on the severity of your injury. Your caregiver may prescribe pain medicines and deep breathing exercises. HOME CARE INSTRUCTIONS  Limit your activities until you can move around without much pain.  Do not do any strenuous work until your injury is healed.  Put ice on the injured area.  Put ice in a plastic bag.  Place a towel between your skin and the bag.  Leave the ice on for 15-20 minutes, 03-04 times a day.  You may wear a rib belt as directed by your caregiver to reduce pain.  Practice deep breathing as directed by your caregiver to keep your lungs clear.  Only take over-the-counter or prescription medicines for pain, fever, or discomfort as directed by your caregiver. SEEK IMMEDIATE MEDICAL  CARE IF:   You have increasing pain or shortness of breath.  You cough up blood.  You have nausea, vomiting, or abdominal pain.  You have a fever.  You feel dizzy, weak, or you faint. MAKE SURE YOU:  Understand these instructions.  Will watch your condition.  Will get help right away if you are not doing well or get worse. Document Released: 09/01/2004 Document Revised: 10/17/2011 Document Reviewed: 05/11/2011 [facility] Patient Information 2014 [place], [place].  Urinary Tract Infection Urinary tract infections (UTIs) can develop anywhere along your urinary tract. Your urinary tract is your body's drainage system for removing wastes and extra water. Your urinary tract includes two kidneys, two ureters, a bladder, and a urethra. Your kidneys are a pair of bean-shaped organs. Each kidney is about the size of your fist. They are located below your ribs, one on each side of your spine. CAUSES Infections are caused by microbes, which are microscopic organisms, including fungi, viruses, and bacteria. These organisms are so small that they can only be seen through a microscope. Bacteria are the microbes that most commonly cause UTIs. SYMPTOMS  Symptoms of UTIs may vary by age and gender of the patient and by the location of the infection. Symptoms in young women typically include a frequent and intense urge to urinate and a painful, burning feeling in the bladder or urethra during urination. Older women and men are more likely to be tired, shaky, and weak and have muscle aches and abdominal pain. A fever may mean the infection is in your kidneys.  Other symptoms of a kidney infection include pain in your back or sides below the ribs, nausea, and vomiting. DIAGNOSIS To diagnose a UTI, your caregiver will ask you about your symptoms. Your caregiver also will ask to provide a urine sample. The urine sample will be tested for bacteria and white blood cells. White blood cells are made by your body to  help fight infection. TREATMENT  Typically, UTIs can be treated with medication. Because most UTIs are caused by a bacterial infection, they usually can be treated with the use of antibiotics. The choice of antibiotic and length of treatment depend on your symptoms and the type of bacteria causing your infection. HOME CARE INSTRUCTIONS  If you were prescribed antibiotics, take them exactly as your caregiver instructs you. Finish the medication even if you feel better after you have only taken some of the medication.  Drink enough water and fluids to keep your urine clear or pale yellow.  Avoid caffeine, tea, and carbonated beverages. They tend to irritate your bladder.  Empty your bladder often. Avoid holding urine for long periods of time.  Empty your bladder before and after sexual intercourse.  After a bowel movement, women should cleanse from front to back. Use each tissue only once. SEEK MEDICAL CARE IF:   You have back pain.  You develop a fever.  Your symptoms do not begin to resolve within 3 days. SEEK IMMEDIATE MEDICAL CARE IF:   You have severe back pain or lower abdominal pain.  You develop chills.  You have nausea or vomiting.  You have continued burning or discomfort with urination. MAKE SURE YOU:   Understand these instructions.  Will watch your condition.  Will get help right away if you are not doing well or get worse. Document Released: 05/04/2005 Document Revised: 01/24/2012 Document Reviewed: 09/02/2011 [facility] Patient Information 2014 [place], [place].  Acetaminophen; Oxycodone tablets What is this medicine? ACETAMINOPHEN; OXYCODONE (a set a MEE noe fen; ox i KOE done) is a pain reliever. It is used to treat mild to moderate pain. This medicine may be used for other purposes; ask your health care provider or pharmacist if you have questions. COMMON BRAND NAME(S): Endocet, Magnacet, Narvox, Percocet, Perloxx, Primalev, Primlev, Roxicet, Xolox What  should I tell my health care provider before I take this medicine? They need to know if you have any of these conditions: -brain tumor -Crohn's disease, inflammatory bowel disease, or ulcerative colitis -drug abuse or addiction -head injury -heart or circulation problems -if you often drink alcohol -kidney disease or problems going to the bathroom -liver disease -lung disease, asthma, or breathing problems -an unusual or allergic reaction to acetaminophen, oxycodone, other opioid analgesics, other medicines, foods, dyes, or preservatives -pregnant or trying to get pregnant -breast-feeding How should I use this medicine? Take this medicine by mouth with a full glass of water. Follow the directions on the prescription label. Take your medicine at regular intervals. Do not take your medicine more often than directed. Talk to your pediatrician regarding the use of this medicine in children. Special care may be needed. Patients over 38 years old may have a stronger reaction and need a smaller dose. Overdosage: If you think you have taken too much of this medicine contact a poison control center or emergency room at once. NOTE: This medicine is only for you. Do not share this medicine with others. What if I miss a dose? If you miss a dose, take it as soon as you can. If it  is almost time for your next dose, take only that dose. Do not take double or extra doses. What may interact with this medicine? -alcohol -antihistamines -barbiturates like amobarbital, butalbital, butabarbital, methohexital, pentobarbital, phenobarbital, thiopental, and secobarbital -benztropine -drugs for bladder problems like solifenacin, trospium, oxybutynin, tolterodine, hyoscyamine, and methscopolamine -drugs for breathing problems like ipratropium and tiotropium -drugs for certain stomach or intestine problems like propantheline, homatropine methylbromide, glycopyrrolate, atropine, belladonna, and  dicyclomine -general anesthetics like etomidate, ketamine, nitrous oxide, propofol, desflurane, enflurane, halothane, isoflurane, and sevoflurane -medicines for depression, anxiety, or psychotic disturbances -medicines for sleep -muscle relaxants -naltrexone -narcotic medicines (opiates) for pain -phenothiazines like perphenazine, thioridazine, chlorpromazine, mesoridazine, fluphenazine, prochlorperazine, promazine, and trifluoperazine -scopolamine -tramadol -trihexyphenidyl This list may not describe all possible interactions. Give your health care provider a list of all the medicines, herbs, non-prescription drugs, or dietary supplements you use. Also tell them if you smoke, drink alcohol, or use illegal drugs. Some items may interact with your medicine. What should I watch for while using this medicine? Tell your doctor or health care professional if your pain does not go away, if it gets worse, or if you have new or a different type of pain. You may develop tolerance to the medicine. Tolerance means that you will need a higher dose of the medication for pain relief. Tolerance is normal and is expected if you take this medicine for a long time. Do not suddenly stop taking your medicine because you may develop a severe reaction. Your body becomes used to the medicine. This does NOT mean you are addicted. Addiction is a behavior related to getting and using a drug for a non-medical reason. If you have pain, you have a medical reason to take pain medicine. Your doctor will tell you how much medicine to take. If your doctor wants you to stop the medicine, the dose will be slowly lowered over time to avoid any side effects. You may get drowsy or dizzy. Do not drive, use machinery, or do anything that needs mental alertness until you know how this medicine affects you. Do not stand or sit up quickly, especially if you are an older patient. This reduces the risk of dizzy or fainting spells. Alcohol may  interfere with the effect of this medicine. Avoid alcoholic drinks. There are different types of narcotic medicines (opiates) for pain. If you take more than one type at the same time, you may have more side effects. Give your health care provider a list of all medicines you use. Your doctor will tell you how much medicine to take. Do not take more medicine than directed. Call emergency for help if you have problems breathing. The medicine will cause constipation. Try to have a bowel movement at least every 2 to 3 days. If you do not have a bowel movement for 3 days, call your doctor or health care professional. Do not take Tylenol (acetaminophen) or medicines that have acetaminophen with this medicine. Too much acetaminophen can be very dangerous. Many nonprescription medicines contain acetaminophen. Always read the labels carefully to avoid taking more acetaminophen. What side effects may I notice from receiving this medicine? Side effects that you should report to your doctor or health care professional as soon as possible: -allergic reactions like skin rash, itching or hives, swelling of the face, lips, or tongue -breathing difficulties, wheezing -confusion -light headedness or fainting spells -severe stomach pain -unusually weak or tired -yellowing of the skin or the whites of the eyes  Side effects that  usually do not require medical attention (report to your doctor or health care professional if they continue or are bothersome): -dizziness -drowsiness -nausea -vomiting This list may not describe all possible side effects. Call your doctor for medical advice about side effects. You may report side effects to FDA at 1-800-FDA-1088. Where should I keep my medicine? Keep out of the reach of children. This medicine can be abused. Keep your medicine in a safe place to protect it from theft. Do not share this medicine with anyone. Selling or giving away this medicine is dangerous and against the  law. Store at room temperature between 20 and 25 degrees C (68 and 77 degrees F). Keep container tightly closed. Protect from light. This medicine may cause accidental overdose and death if it is taken by other adults, children, or pets. Flush any unused medicine down the toilet to reduce the chance of harm. Do not use the medicine after the expiration date. NOTE: This sheet is a summary. It may not cover all possible information. If you have questions about this medicine, talk to your doctor, pharmacist, or health care provider.  2014, [employer]. (2013-03-18 13:17:35)  Nitrofurantoin tablets or capsules What is this medicine? NITROFURANTOIN (nye troe fyoor AN toyn) is an antibiotic. It is used to treat urinary tract infections. This medicine may be used for other purposes; ask your health care provider or pharmacist if you have questions. COMMON BRAND NAME(S): Macrobid, Macrodantin, Urotoin What should I tell my health care provider before I take this medicine? They need to know if you have any of these conditions: -anemia -diabetes -glucose-6-phosphate dehydrogenase deficiency -kidney disease -liver disease -lung disease -other chronic illness -an unusual or allergic reaction to nitrofurantoin, other antibiotics, other medicines, foods, dyes or preservatives -pregnant or trying to get pregnant -breast-feeding How should I use this medicine? Take this medicine by mouth with a glass of water. Follow the directions on the prescription label. Take this medicine with food or milk. Take your doses at regular intervals. Do not take your medicine more often than directed. Do not stop taking except on your doctor's advice. Talk to your pediatrician regarding the use of this medicine in children. While this drug may be prescribed for selected conditions, precautions do apply. Overdosage: If you think you have taken too much of this medicine contact a poison control center or emergency  room at once. NOTE: This medicine is only for you. Do not share this medicine with others. What if I miss a dose? If you miss a dose, take it as soon as you can. If it is almost time for your next dose, take only that dose. Do not take double or extra doses. What may interact with this medicine? -antacids containing magnesium trisilicate -probenecid -quinolone antibiotics like ciprofloxacin, lomefloxacin, norfloxacin and ofloxacin -sulfinpyrazone This list may not describe all possible interactions. Give your health care provider a list of all the medicines, herbs, non-prescription drugs, or dietary supplements you use. Also tell them if you smoke, drink alcohol, or use illegal drugs. Some items may interact with your medicine. What should I watch for while using this medicine? Tell your doctor or health care professional if your symptoms do not improve or if you get new symptoms. Drink several glasses of water a day. If you are taking this medicine for a long time, visit your doctor for regular checks on your progress. If you are diabetic, you may get a false positive result for sugar in your urine with certain  brands of urine tests. Check with your doctor. What side effects may I notice from receiving this medicine? Side effects that you should report to your doctor or health care professional as soon as possible: -allergic reactions like skin rash or hives, swelling of the face, lips, or tongue -chest pain -cough -difficulty breathing -dizziness, drowsiness -fever or infection -joint aches or pains -pale or blue-tinted skin -redness, blistering, peeling or loosening of the skin, including inside the mouth -tingling, burning, pain, or numbness in hands or feet -unusual bleeding or bruising -unusually weak or tired -yellowing of eyes or skin Side effects that usually do not require medical attention (report to your doctor or health care professional if they continue or are  bothersome): -dark urine -diarrhea -headache -loss of appetite -nausea or vomiting -temporary hair loss This list may not describe all possible side effects. Call your doctor for medical advice about side effects. You may report side effects to FDA at 1-800-FDA-1088. Where should I keep my medicine? Keep out of the reach of children. Store at room temperature between 15 and 30 degrees C (59 and 86 degrees F). Protect from light. Throw away any unused medicine after the expiration date. NOTE: This sheet is a summary. It may not cover all possible information. If you have questions about this medicine, talk to your doctor, pharmacist, or health care provider.  2014, [employer]vier/[employer] Standard. (2008-02-13 15:56:47)

## 2013-11-17 NOTE — ED Notes (Signed)
Dr Preston Fleeting aware of elevated BP. Pt to f/u with PMD.

## 2013-11-17 NOTE — ED Notes (Signed)
Family at bedside. Patient states that she is hurting bad. States that she is having numbness in right check down right leg. States she is having difficulty breathing and coughing up fleam.

## 2013-11-19 LAB — URINE CULTURE: Colony Count: >=100000

## 2013-12-22 ENCOUNTER — Encounter (HOSPITAL_COMMUNITY): Payer: Self-pay | Admitting: Emergency Medicine

## 2013-12-22 ENCOUNTER — Emergency Department (HOSPITAL_COMMUNITY): Payer: Medicaid Other

## 2013-12-22 ENCOUNTER — Inpatient Hospital Stay (HOSPITAL_COMMUNITY)
Admission: EM | Admit: 2013-12-22 | Discharge: 2013-12-24 | DRG: 059 | Disposition: A | Discharge status: Home / Self Care | Payer: Medicaid Other | Attending: Internal Medicine | Admitting: Internal Medicine

## 2013-12-22 DIAGNOSIS — R29898 Other symptoms and signs involving the musculoskeletal system: Secondary | ICD-10-CM | POA: Diagnosis present

## 2013-12-22 DIAGNOSIS — R29810 Facial weakness: Secondary | ICD-10-CM | POA: Diagnosis present

## 2013-12-22 DIAGNOSIS — Z8249 Family history of ischemic heart disease and other diseases of the circulatory system: Secondary | ICD-10-CM

## 2013-12-22 DIAGNOSIS — Z79899 Other long term (current) drug therapy: Secondary | ICD-10-CM

## 2013-12-22 DIAGNOSIS — F17200 Nicotine dependence, unspecified, uncomplicated: Secondary | ICD-10-CM | POA: Diagnosis present

## 2013-12-22 DIAGNOSIS — I69959 Hemiplegia and hemiparesis following unspecified cerebrovascular disease affecting unspecified side: Secondary | ICD-10-CM

## 2013-12-22 DIAGNOSIS — G43809 Other migraine, not intractable, without status migrainosus: Secondary | ICD-10-CM | POA: Diagnosis present

## 2013-12-22 DIAGNOSIS — Z833 Family history of diabetes mellitus: Secondary | ICD-10-CM

## 2013-12-22 DIAGNOSIS — F411 Generalized anxiety disorder: Secondary | ICD-10-CM | POA: Diagnosis present

## 2013-12-22 DIAGNOSIS — J45909 Unspecified asthma, uncomplicated: Secondary | ICD-10-CM

## 2013-12-22 DIAGNOSIS — R55 Syncope and collapse: Secondary | ICD-10-CM

## 2013-12-22 DIAGNOSIS — Z7982 Long term (current) use of aspirin: Secondary | ICD-10-CM

## 2013-12-22 DIAGNOSIS — G35 Multiple sclerosis: Principal | ICD-10-CM

## 2013-12-22 DIAGNOSIS — R471 Dysarthria and anarthria: Secondary | ICD-10-CM | POA: Diagnosis present

## 2013-12-22 LAB — BASIC METABOLIC PANEL
BUN: 9 mg/dL (ref 6–23)
CO2: 25 mEq/L (ref 19–32)
Calcium: 9.3 mg/dL (ref 8.4–10.5)
Chloride: 104 mEq/L (ref 96–112)
Creatinine, Ser: 0.71 mg/dL (ref 0.50–1.10)
GFR calc Af Amer: >90 mL/min (ref >90)
GFR calc non Af Amer: >90 mL/min (ref >90)
Glucose, Bld: 80 mg/dL (ref 70–99)
Potassium: 3.5 mEq/L — ABNORMAL LOW (ref 3.7–5.3)
Sodium: 139 mEq/L (ref 137–147)

## 2013-12-22 LAB — CBC
HCT: 35.4 % — ABNORMAL LOW (ref 36.0–46.0)
HCT: 36.2 % (ref 36.0–46.0)
Hemoglobin: 12.3 g/dL (ref 12.0–15.0)
Hemoglobin: 12.4 g/dL (ref 12.0–15.0)
MCH: 31 pg (ref 26.0–34.0)
MCH: 30.5 pg (ref 26.0–34.0)
MCHC: 34.7 g/dL (ref 30.0–36.0)
MCHC: 34.3 g/dL (ref 30.0–36.0)
MCV: 89.2 fL (ref 78.0–100.0)
MCV: 88.9 fL (ref 78.0–100.0)
Platelets: 250 10*3/uL (ref 150–400)
Platelets: 216 10*3/uL (ref 150–400)
RBC: 3.97 MIL/uL (ref 3.87–5.11)
RBC: 4.07 MIL/uL (ref 3.87–5.11)
RDW: 14.2 % (ref 11.5–15.5)
RDW: 14.1 % (ref 11.5–15.5)
WBC: 9.9 10*3/uL (ref 4.0–10.5)
WBC: 7.4 10*3/uL (ref 4.0–10.5)

## 2013-12-22 LAB — RAPID URINE DRUG SCREEN, HOSP PERFORMED
Amphetamines: NOT DETECTED
Barbiturates: NOT DETECTED
Benzodiazepines: POSITIVE — AB
Cocaine: NOT DETECTED
Opiates: NOT DETECTED
Tetrahydrocannabinol: NOT DETECTED

## 2013-12-22 LAB — CREATININE, SERUM
Creatinine, Ser: 0.71 mg/dL (ref 0.50–1.10)
GFR calc Af Amer: >90 mL/min (ref >90)
GFR calc non Af Amer: >90 mL/min (ref >90)

## 2013-12-22 LAB — TSH: TSH: 4.28 u[IU]/mL (ref 0.350–4.500)

## 2013-12-22 MED ORDER — POTASSIUM CHLORIDE CRYS ER 20 MEQ PO TBCR
40.0000 meq | EXTENDED_RELEASE_TABLET | Freq: Two times a day (BID) | ORAL | Status: AC
Start: 2013-12-22 — End: 2013-12-22
  Administered 2013-12-22 (×2): 40 meq via ORAL
  Filled 2013-12-22 (×3): qty 2

## 2013-12-22 MED ORDER — SODIUM CHLORIDE 0.9 % IJ SOLN
3.0000 mL | Freq: Two times a day (BID) | INTRAMUSCULAR | Status: DC
  Administered 2013-12-22 – 2013-12-24 (×4): 3 mL via INTRAVENOUS

## 2013-12-22 MED ORDER — FENTANYL CITRATE 0.05 MG/ML IJ SOLN: 50.0000 ug | Freq: Once | INTRAMUSCULAR | Status: DC

## 2013-12-22 MED ORDER — PROCHLORPERAZINE EDISYLATE 5 MG/ML IJ SOLN
10.0000 mg | Freq: Once | INTRAMUSCULAR | Status: AC
  Administered 2013-12-22: 10 mg via INTRAVENOUS
  Filled 2013-12-22: qty 2

## 2013-12-22 MED ORDER — ALBUTEROL SULFATE HFA 108 (90 BASE) MCG/ACT IN AERS: 2.0000 | INHALATION_SPRAY | Freq: Four times a day (QID) | RESPIRATORY_TRACT | Status: DC | PRN

## 2013-12-22 MED ORDER — VALPROATE SODIUM 500 MG/5ML IV SOLN
500.0000 mg | Freq: Once | INTRAVENOUS | Status: AC
  Administered 2013-12-22: 500 mg via INTRAVENOUS
  Filled 2013-12-22: qty 5

## 2013-12-22 MED ORDER — DIPHENHYDRAMINE HCL 50 MG/ML IJ SOLN
25.0000 mg | Freq: Once | INTRAMUSCULAR | Status: AC
  Administered 2013-12-22: 25 mg via INTRAVENOUS
  Filled 2013-12-22: qty 1

## 2013-12-22 MED ORDER — LABETALOL HCL 5 MG/ML IV SOLN
20.0000 mg | Freq: Once | INTRAVENOUS | Status: AC
  Administered 2013-12-22: 20 mg via INTRAVENOUS
  Filled 2013-12-22: qty 4

## 2013-12-22 MED ORDER — PNEUMOCOCCAL VAC POLYVALENT 25 MCG/0.5ML IJ INJ
0.5000 mL | INJECTION | INTRAMUSCULAR | Status: AC
  Administered 2013-12-24: 0.5 mL via INTRAMUSCULAR
  Filled 2013-12-22: qty 0.5

## 2013-12-22 MED ORDER — OXYCODONE-ACETAMINOPHEN 5-325 MG PO TABS
1.0000 | ORAL_TABLET | ORAL | Status: DC | PRN
  Administered 2013-12-22 – 2013-12-24 (×11): 1 via ORAL
  Filled 2013-12-22 (×11): qty 1

## 2013-12-22 MED ORDER — ALBUTEROL SULFATE (2.5 MG/3ML) 0.083% IN NEBU: 2.5000 mg | INHALATION_SOLUTION | Freq: Four times a day (QID) | RESPIRATORY_TRACT | Status: DC | PRN

## 2013-12-22 MED ORDER — ASPIRIN 81 MG PO CHEW
81.0000 mg | CHEWABLE_TABLET | Freq: Every day | ORAL | Status: DC
  Administered 2013-12-22 – 2013-12-24 (×3): 81 mg via ORAL
  Filled 2013-12-22 (×3): qty 1

## 2013-12-22 MED ORDER — SODIUM CHLORIDE 0.9 % IV SOLN
500.0000 mg | Freq: Two times a day (BID) | INTRAVENOUS | Status: DC
  Administered 2013-12-22 – 2013-12-23 (×3): 500 mg via INTRAVENOUS
  Filled 2013-12-22 (×4): qty 4

## 2013-12-22 MED ORDER — POLYETHYLENE GLYCOL 3350 17 G PO PACK
17.0000 g | PACK | Freq: Every day | ORAL | Status: DC | PRN
  Filled 2013-12-22: qty 1

## 2013-12-22 MED ORDER — SODIUM CHLORIDE 0.9 % IV SOLN: 250.0000 mL | INTRAVENOUS | Status: DC | PRN

## 2013-12-22 MED ORDER — ONDANSETRON HCL 4 MG PO TABS: 4.0000 mg | ORAL_TABLET | Freq: Four times a day (QID) | ORAL | Status: DC | PRN

## 2013-12-22 MED ORDER — GADOBENATE DIMEGLUMINE 529 MG/ML IV SOLN
10.0000 mL | Freq: Once | INTRAVENOUS | Status: AC
  Administered 2013-12-22: 8 mL via INTRAVENOUS

## 2013-12-22 MED ORDER — ONDANSETRON HCL 4 MG/2ML IJ SOLN: 4.0000 mg | Freq: Four times a day (QID) | INTRAMUSCULAR | Status: DC | PRN

## 2013-12-22 MED ORDER — SODIUM CHLORIDE 0.9 % IJ SOLN: 3.0000 mL | INTRAMUSCULAR | Status: DC | PRN

## 2013-12-22 MED ORDER — HEPARIN SODIUM (PORCINE) 5000 UNIT/ML IJ SOLN
5000.0000 [IU] | Freq: Three times a day (TID) | INTRAMUSCULAR | Status: DC
  Administered 2013-12-22 – 2013-12-24 (×6): 5000 [IU] via SUBCUTANEOUS
  Filled 2013-12-22 (×11): qty 1

## 2013-12-22 NOTE — Consult Note (Signed)
Neurology Consultation Reason for Consult: Syncope, headaceh, weakness Referring Physician: Dayna Barker  CC: Weakness  History is obtained from:patient   HPI: [name] is a 38 y.o. female with a history of MS and stroke who presents with syncope followed by headache. 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 She has had now approximately 20 spells of syncope followed by headache and worsening of her previous MS deficits. She follows a neurologist in [place], but has not seen him in a while and is not on any immune modulating agents.   Of note, her daughter went into false labor last night, is [redacted] weeks pregnant.   ROS: A 14 point ROS was performed and is negative except as noted in the HPI.  Past Medical History  Diagnosis Date  . MS (multiple sclerosis)   . Stroke   . Anxiety   . Bulging disc     Family History: No hx similar  Social History: Tob: current smoker  Exam: Current vital signs: BP 134/96  Pulse 96  Temp(Src) 97.5 F (36.4 C) (Oral)  Resp 23  Ht 4\' 11"  (1.499 m)  Wt 39.009 kg (86 lb)  BMI 17.36 kg/m2  SpO2 97% Vital signs in last 24 hours: Temp:  [97.5 F (36.4 C)] 97.5 F (36.4 C) (05/17 0436) Pulse Rate:  [88-104] 96 (05/17 0830) Resp:  [12-27] 23 (05/17 0830) BP: (134-181)/(96-135) 134/96 mmHg (05/17 0830) SpO2:  [97 %-99 %] 97 % (05/17 0830) Weight:  [39.009 kg (86 lb)] 39.009 kg (86 lb) (05/17 0436)  General: in bed, NAD CV: rr Mental Status: Patient is awake, alert, oriented to person, place, month, year, and situation. Immediate and remote memory are intact. Patient is able to give a clear and coherent history. No signs of aphasia or neglect Cranial Nerves: II: Visual Fields are full. Pupils are equal, round, and reactive to light.  Discs are difficult to visualize. III,IV, VI: EOMI without ptosis or diploplia.  V: Facial sensation is decreased on the left VII: Facial movement is notable for right lower  face weaknes sand unable to open left eye(though pt states this is due to eye discomfort) VIII: hearing is intact to voice X: Uvula elevates symmetrically XI: Shoulder shrug is symmetric. XII: tongue is midline without atrophy or fasciculations.  Motor: Tone is normal. Bulk is normal. 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  Deep Tendon Reflexes: 2+ and symmetric in the biceps and patellae.  Cerebellar: Unable to perform on left side Gait: Non-ambulatory at baseline.      I have reviewed labs in epic and the results pertinent to this consultation are: Neg ua Neg CBC  I have reviewed the images obtained:CT head- white matter changes  Impression: 38 yo F with reported history of MS. Initially I had seen her CT with white matter changes and with her history of MS, felt that steroids may be beneficial. After further reviewing her chart, she has had multiple visits with similar complaints with negative MRIs. There were some inconcsistencies on exam adn I do have some concern for functional illness. The white matter cahnges on MRI are certainly concerning for a 38 yo, but it could represent changes secondary to smoking and hypertension rather than MS.   It is possible that she has MS and it may be reasonable to do a three day course of steroids.  Recommendations: 1) Solumedrol 1gm x 3 days.  2) PT, OT 3) will continue to follow.     Ritta SlotMcNeill Chibuike Fleek, MD [facility] [phone]  If 7pm- 7am, please page neurology on call as listed in [facility].

## 2013-12-22 NOTE — Discharge Instructions (Signed)
Abdominal Pain, Adult °Many things can cause abdominal pain. Usually, abdominal pain is not caused by a disease and will improve without treatment. It can often be observed and treated at home. Your health care provider will do a physical exam and possibly order blood tests and X-rays to help determine the seriousness of your pain. However, in many cases, more time must pass before a clear cause of the pain can be found. Before that point, your health care provider may not know if you need more testing or further treatment. °HOME CARE INSTRUCTIONS  °Monitor your abdominal pain for any changes. The following actions may help to alleviate any discomfort you are experiencing: °· Only take over-the-counter or prescription medicines as directed by your health care provider. °· Do not take laxatives unless directed to do so by your health care provider. °· Try a clear liquid diet (broth, tea, or water) as directed by your health care provider. Slowly move to a bland diet as tolerated. °SEEK MEDICAL CARE IF: °· You have unexplained abdominal pain. °· You have abdominal pain associated with nausea or diarrhea. °· You have pain when you urinate or have a bowel movement. °· You experience abdominal pain that wakes you in the night. °· You have abdominal pain that is worsened or improved by eating food. °· You have abdominal pain that is worsened with eating fatty foods. °SEEK IMMEDIATE MEDICAL CARE IF:  °· Your pain does not go away within 2 hours. °· You have a fever. °· You keep throwing up (vomiting). °· Your pain is felt only in portions of the abdomen, such as the right side or the left lower portion of the abdomen. °· You pass bloody or black tarry stools. °MAKE SURE YOU: °· Understand these instructions.   °· Will watch your condition.   °· Will get help right away if you are not doing well or get worse.   °Document Released: 05/04/2005 Document Revised: 05/15/2013 Document Reviewed: 04/03/2013 °[employer]® Patient  Information ©2014 [employer]. ° °

## 2013-12-22 NOTE — ED Notes (Signed)
Pt c/o right sided headache. Pt has hx of stroke with left sided deficits. Pt's speech is difficult to understand due to past stroke. Pt also states she feels like she is going to pass out. Pt reports symptoms for three hours

## 2013-12-22 NOTE — ED Notes (Signed)
Patient transported to MRI 

## 2013-12-22 NOTE — H&P (Signed)
[facility] History and Physical  [name] ZOX:[id number] DOB: 1976/03/14 DOA: 12/22/2013  Referring physician: Dr. Margarito Courserkaztkowski PCP: Brianna StallingNDIEGO,Brianna M, MD   Chief Complaint: right side weakness.  HPI: Brianna ConnersWindi L Reilly is a 38 y.o. female  Past medical history of MS currently not on any therapy and stroke that comes in for syncope following headache. She she's had several episodes of syncope and headaches. She says she went to bed on the day prior to admission and woke up with left side facial weakness, upper and lower extremity worsening contractions. She has also had been having dysarthria for the past 24 hours. No difficulty swallowing or coughing with eating.   Review of Systems:  Constitutional:  No weight loss, night sweats, Fevers, chills, fatigue.  HEENT:  No headaches, Difficulty swallowing,Tooth/dental problems,Sore throat,  No sneezing, itching, ear ache, nasal congestion, post nasal drip,  Cardio-vascular:  No chest pain, Orthopnea, PND, swelling in lower extremities, anasarca, dizziness, palpitations  GI:  No heartburn, indigestion, abdominal pain, nausea, vomiting, diarrhea, change in bowel habits, loss of appetite  Resp:  No shortness of breath with exertion or at rest. No excess mucus, no productive cough, No non-productive cough, No coughing up of blood.No change in color of mucus.No wheezing.No chest wall deformity  Skin:  no rash or lesions.  GU:  no dysuria, change in color of urine, no urgency or frequency. No flank pain.  Musculoskeletal:  No joint pain or swelling. No decreased range of motion. No back pain.  Psych:  No change in mood or affect. No depression or anxiety. No memory loss.   Past Medical History  Diagnosis Date  . MS (multiple sclerosis)   . Stroke   . Anxiety   . Bulging disc    Past Surgical History  Procedure Laterality Date  . Tubal ligation    . Wisdonm teeth     Social History:  reports that she has been smoking  Cigarettes.  She has been smoking about 0.50 packs per day. She does not have any smokeless tobacco history on file. She reports that she does not drink alcohol or use illicit drugs.  Allergies  Allergen Reactions  . Naproxen Shortness Of Breath  . Codeine Hives  . Penicillins Swelling  . Sulfa Antibiotics Itching    Family History  Problem Relation Age of Onset  . Hypertension Mother   . Diabetes Mellitus II Mother   . Heart failure Mother   . Other Father      Prior to Admission medications   Medication Sig Start Date End Date Taking? Authorizing Provider  albuterol (PROVENTIL HFA;VENTOLIN HFA) 108 (90 BASE) MCG/ACT inhaler Inhale 2 puffs into the lungs every 6 (six) hours as needed (asthma). Asthma.   Yes Historical Provider, MD  albuterol (PROVENTIL) (2.5 MG/3ML) 0.083% nebulizer solution Take 2.5 mg by nebulization every 6 (six) hours as needed (asthma). Asthma.   Yes Historical Provider, MD  aspirin 81 MG chewable tablet Chew 81 mg by mouth daily.   Yes Historical Provider, MD  Multiple Vitamins-Minerals (CENTRUM ULTRA WOMENS) TABS Take 1 tablet by mouth daily.   Yes Historical Provider, MD  oxyCODONE-acetaminophen (PERCOCET/ROXICET) 5-325 MG per tablet Take 1 tablet by mouth every 4 (four) hours as needed for severe pain. 11/17/13  Yes Dione Boozeavid Glick, MD   Physical Exam: Filed Vitals:   12/22/13 1141  BP:   Pulse:   Temp: 97.6 F (36.4 C)  Resp:     BP 144/101  Pulse 86  Temp(Src)  97.6 F (36.4 C) (Oral)  Resp 23  Ht 4\' 11"  (1.499 Reilly)  Wt 39.009 kg (86 lb)  BMI 17.36 kg/m2  SpO2 99%  General:  Appears calm and comfortable Eyes: PERRL, normal lids, irises & conjunctiva, her left eyelid is closed ENT: grossly normal hearing, lips & tongue Neck: no LAD, masses or thyromegaly Cardiovascular: RRR, no Reilly/r/g. No LE edema. Telemetry: SR, no arrhythmias  Respiratory: CTA bilaterally, no w/r/r. Normal respiratory effort. Abdomen: soft, ntnd Skin: no rash or induration  seen on limited exam Musculoskeletal: grossly normal tone BUE/BLE Psychiatric: grossly normal mood and affect, speech fluent and appropriate Neurologic: III,IV, VI: EOMI without or diploplia. With ptosis of the left eye V: Facial sensation is  decreased on the left , VII: Facial movement is notable for right lower face weakness. VIII: hearing is intact to voice. XII: tongue is midline without atrophy or fasciculations. Motor: Tone is normal. Bulk is normal. 5/5 strength was present on the right, on the left she has a spastic hemiparesis Sensory:  Sensation is diminished on the left. Deep Tendon Reflexes: 2+ and symmetric in the biceps and patellae. Cerebellar: FNF  intact bilaterally.Gait: was unable to test           Labs on Admission:  Basic Metabolic Panel:  Recent Labs Lab 12/22/13 0545  NA 139  K 3.5*  CL 104  CO2 25  GLUCOSE 80  BUN 9  CREATININE 0.71  CALCIUM 9.3   Liver Function Tests: No results found for this basename: AST, ALT, ALKPHOS, BILITOT, PROT, ALBUMIN,  in the last 168 hours No results found for this basename: LIPASE, AMYLASE,  in the last 168 hours No results found for this basename: AMMONIA,  in the last 168 hours CBC:  Recent Labs Lab 12/22/13 0545  WBC 9.9  HGB 12.3  HCT 35.4*  MCV 89.2  PLT 250   Cardiac Enzymes: No results found for this basename: CKTOTAL, CKMB, CKMBINDEX, TROPONINI,  in the last 168 hours  BNP (last 3 results) No results found for this basename: PROBNP,  in the last 8760 hours CBG: No results found for this basename: GLUCAP,  in the last 168 hours  Radiological Exams on Admission: Ct Head Wo Contrast  12/22/2013   CLINICAL DATA:  Severe headache.  EXAM: CT HEAD WITHOUT CONTRAST  TECHNIQUE: Contiguous axial images were obtained from the base of the skull through the vertex without intravenous contrast.  COMPARISON:  05/21/2012.  FINDINGS: Interval rounded area of low density in the subcortical white matter of the left parietal  lobe on image 17. Interval similar area in the subcortical white matter of the posterior aspect of the right frontal lobe on image 16. Minimal patchy white matter low density elsewhere in both cerebral hemispheres. Normal size and position of the ventricles. No intracranial hemorrhage. Unremarkable bones and included paranasal sinuses.  IMPRESSION: Interval areas of white matter low density in both cerebral hemispheres. Any of these could potentially represent areas of acute ischemic change. Further evaluation with pre and postcontrast magnetic resonance imaging of the brain is recommended to exclude other causes such as infection or metastatic disease.   Electronically Signed   By: Gordan Payment Reilly.D.   On: 12/22/2013 06:19   Mr Laqueta Jean UY Contrast  12/22/2013   CLINICAL DATA:  Left hemi paresis  EXAM: MRI HEAD WITHOUT AND WITH CONTRAST  TECHNIQUE: Multiplanar, multiecho pulse sequences of the brain and surrounding structures were obtained without and with intravenous contrast.  CONTRAST:  8mL MULTIHANCE GADOBENATE DIMEGLUMINE 529 MG/ML IV SOLN  COMPARISON:  CT head 12/22/2013.  MRI 08/27/2008  FINDINGS: Image quality degraded by mild motion.  Negative for acute infarct.  Hyperintensities in the white matter have progressed since 2010. There are new lesions in the right frontal white matter, left frontal white matter, and left parietal subcortical white matter. These correspond to the CT abnormalities and are most consistent with chronic ischemia. No cortical infarct. Brainstem and cerebellum are intact.  Negative for hemorrhage or mass.  Postcontrast imaging reveals normal enhancement.  IMPRESSION: No acute infarct. Progression of chronic white matter changes since 2010.   Electronically Signed   By: Marlan Palau Reilly.D.   On: 12/22/2013 10:32    EKG: Independently reviewed. none  Assessment/Plan Syncope/Headache/ possible Multiple sclerosis flareDysarthria: - Neuro was consulted and recommended an MRI as  above. She was started on high dose Solu-Medrol. - The most likely cause of her syncope and headache is probably a multiple sclerosis flare. She does have some abnormality in her MRI on her T2 flare. Get PT and OT.   Code Status: full Family Communication: Husband, mother and daughter Disposition Plan: inpatient  Time spent: 60 minutes  Marinda Elk [facility] Pager [phone]

## 2013-12-22 NOTE — ED Notes (Signed)
Bad occipital h/a. The h/a was so bad last night that she passed out. The h/a is so bad. At [facility] ~ 0100 with daughter and passed out there.

## 2013-12-22 NOTE — ED Notes (Signed)
Dr. Amada JupiterKirkpatrick at bedside updating patient and family on MRI results and plan for admission.

## 2013-12-22 NOTE — ED Notes (Addendum)
Neurohospitalist at bedside 

## 2013-12-22 NOTE — ED Provider Notes (Signed)
CSN: [id number]     Arrival date & time 12/22/13  0423 History   First MD Initiated Contact with Patient 12/22/13 [id number]     Chief Complaint  Patient presents with  . Headache     (Consider location/radiation/quality/duration/timing/severity/associated sxs/prior Treatment) HPI  This patient is an unfortunate 38 year old woman with sclerosis and a history of CVA. She has chronic left hemiparesis and is nonambulatory as a result.  She presents today with worsening left hemiparesis and new onset dysarthria. Her symptoms were preceded by a syncopal episode and headache. She has had 20 or more episodes with the same pattern of syncope followed by headache, dysarthria and worsening of left hemiparesis. Ultimately, in the past, speech deficits have resolved completely. However, this for sometime taken weeks.  The patient has diffuse, aching headache. She rates it 8 on a 0-to-10 scale. Denies fever. Denies recent injury. Her symptoms began around 9:30 PM last night.  Past Medical History  Diagnosis Date  . MS (multiple sclerosis)   . Stroke   . Anxiety   . Bulging disc    Past Surgical History  Procedure Laterality Date  . Tubal ligation    . Wisdonm teeth     History reviewed. No pertinent family history. History  Substance Use Topics  . Smoking status: Current Every Day Smoker -- 0.50 packs/day    Types: Cigarettes  . Smokeless tobacco: Not on file  . Alcohol Use: No   OB History   Grav Para Term Preterm Abortions TAB SAB Ect Mult Living                 Review of Systems Ten point review of symptoms performed and is negative with the exception of symptoms noted above.     Allergies  Naproxen; Codeine; Penicillins; and Sulfa antibiotics  Home Medications   Prior to Admission medications   Medication Sig Start Date End Date Taking? Authorizing Provider  albuterol (PROVENTIL HFA;VENTOLIN HFA) 108 (90 BASE) MCG/ACT inhaler Inhale 2 puffs into the lungs every 6 (six) hours  as needed (asthma). Asthma.   Yes Historical Provider, MD  albuterol (PROVENTIL) (2.5 MG/3ML) 0.083% nebulizer solution Take 2.5 mg by nebulization every 6 (six) hours as needed (asthma). Asthma.   Yes Historical Provider, MD  aspirin 81 MG chewable tablet Chew 81 mg by mouth daily.   Yes Historical Provider, MD  Multiple Vitamins-Minerals (CENTRUM ULTRA WOMENS) TABS Take 1 tablet by mouth daily.   Yes Historical Provider, MD  oxyCODONE-acetaminophen (PERCOCET/ROXICET) 5-325 MG per tablet Take 1 tablet by mouth every 4 (four) hours as needed for severe pain. 11/17/13  Yes Dione Boozeavid Glick, MD   BP 174/122  Pulse 104  Temp(Src) 97.5 F (36.4 C) (Oral)  Resp 17  Ht 4\' 11"  (1.499 m)  Wt 86 lb (39.009 kg)  BMI 17.36 kg/m2  SpO2 97% Physical Exam Gen: well developed and well nourished appearing Head: NCAT Eyes: PERL, EOMI Nose: no epistaixis or rhinorrhea Mouth/throat: mucosa is moist and pink Neck: supple, no stridor Lungs: CTA B, no wheezing, rhonchi or rales CV: RRR, no murmur, extremities appear well perfused.  Abd: soft, notender, nondistended Back: no ttp, no cva ttp Skin: warm and dry Ext: normal to inspection, no dependent edema Neuro: left sided facial droop involving forehead, patient seems to be squinting her left eye closed, tongue is twisted, speech is dysarthric, left hemiparesis with contractures of the arm and leg. 5/5 strength right arm and leg.  Psyche; normal affect,  calm and cooperative.  ED Course  Procedures (including critical care time) Labs Review  Results for orders placed during the hospital encounter of 12/22/13 (from the past 24 hour(s))  CBC     Status: Abnormal   Collection Time    12/22/13  5:45 AM      Result Value Ref Range   WBC 9.9  4.0 - 10.5 K/uL   RBC 3.97  3.87 - 5.11 MIL/uL   Hemoglobin 12.3  12.0 - 15.0 g/dL   HCT 51.7 (*) 00.1 - 74.9 %   MCV 89.2  78.0 - 100.0 fL   MCH 31.0  26.0 - 34.0 pg   MCHC 34.7  30.0 - 36.0 g/dL   RDW 44.9  67.5  - 91.6 %   Platelets 250  150 - 400 K/uL  BASIC METABOLIC PANEL     Status: Abnormal   Collection Time    12/22/13  5:45 AM      Result Value Ref Range   Sodium 139  137 - 147 mEq/L   Potassium 3.5 (*) 3.7 - 5.3 mEq/L   Chloride 104  96 - 112 mEq/L   CO2 25  19 - 32 mEq/L   Glucose, Bld 80  70 - 99 mg/dL   BUN 9  6 - 23 mg/dL   Creatinine, Ser 3.84  0.50 - 1.10 mg/dL   Calcium 9.3  8.4 - 66.5 mg/dL   GFR calc non Af Amer >90  >90 mL/min   GFR calc Af Amer >90  >90 mL/min  URINE RAPID DRUG SCREEN (HOSP PERFORMED)     Status: Abnormal   Collection Time    12/22/13  7:04 AM      Result Value Ref Range   Opiates NONE DETECTED  NONE DETECTED   Cocaine NONE DETECTED  NONE DETECTED   Benzodiazepines POSITIVE (*) NONE DETECTED   Amphetamines NONE DETECTED  NONE DETECTED   Tetrahydrocannabinol NONE DETECTED  NONE DETECTED   Barbiturates NONE DETECTED  NONE DETECTED    Imaging Review Ct Head Wo Contrast  12/22/2013   CLINICAL DATA:  Severe headache.  EXAM: CT HEAD WITHOUT CONTRAST  TECHNIQUE: Contiguous axial images were obtained from the base of the skull through the vertex without intravenous contrast.  COMPARISON:  05/21/2012.  FINDINGS: Interval rounded area of low density in the subcortical white matter of the left parietal lobe on image 17. Interval similar area in the subcortical white matter of the posterior aspect of the right frontal lobe on image 16. Minimal patchy white matter low density elsewhere in both cerebral hemispheres. Normal size and position of the ventricles. No intracranial hemorrhage. Unremarkable bones and included paranasal sinuses.  IMPRESSION: Interval areas of white matter low density in both cerebral hemispheres. Any of these could potentially represent areas of acute ischemic change. Further evaluation with pre and postcontrast magnetic resonance imaging of the brain is recommended to exclude other causes such as infection or metastatic disease.   Electronically  Signed   By: Gordan Payment M.D.   On: 12/22/2013 06:19    EKG: nsr, no acute ischemic changes, normal intervals, normal axis, normal qrs complex  MDM   Patient presents with new onset dysarthria and worsening of chronic left hemiparesis. She presented > 6 hours after time of onset and was noted to be hypertensive on arrival. CT brain shows interval areas of white matter low density in both cerebral hemispheres. Labs unremarkable.   Dr. Amada Jupiter of Neuro Hospitalist service has consulted and has ordered MRI  of the brain with and without contrast. Within his differential diagnosis is atypical migraine headache with unmasking of previously compensated chronic neurologic deficits. He has ordered reglan and depakote for treatment of migraine headache with plan to re-evaluate neurologic symptoms following treatment. Ultimately, the patient is likely to be admitted.   However, he would like the patient to remain in the ED for trial of migraine cocktail and MRI. Case signed out to Dr. Cherre Blanc at change of shift. He will follow up on response to cocktail, MR results and Neurology recommendations.     Brandt Loosen, MD 12/22/13 [phone]

## 2013-12-22 NOTE — ED Notes (Signed)
Patient transported to CT 

## 2013-12-23 ENCOUNTER — Inpatient Hospital Stay (HOSPITAL_COMMUNITY): Payer: Medicaid Other

## 2013-12-23 DIAGNOSIS — J45909 Unspecified asthma, uncomplicated: Secondary | ICD-10-CM

## 2013-12-23 DIAGNOSIS — R55 Syncope and collapse: Secondary | ICD-10-CM

## 2013-12-23 LAB — BASIC METABOLIC PANEL
BUN: 7 mg/dL (ref 6–23)
CO2: 19 mEq/L (ref 19–32)
Calcium: 9.4 mg/dL (ref 8.4–10.5)
Chloride: 104 mEq/L (ref 96–112)
Creatinine, Ser: 0.65 mg/dL (ref 0.50–1.10)
GFR calc Af Amer: >90 mL/min (ref >90)
GFR calc non Af Amer: >90 mL/min (ref >90)
Glucose, Bld: 139 mg/dL — ABNORMAL HIGH (ref 70–99)
Potassium: 4.6 mEq/L (ref 3.7–5.3)
Sodium: 137 mEq/L (ref 137–147)

## 2013-12-23 MED ORDER — SODIUM CHLORIDE 0.9 % IV SOLN
1000.0000 mg | Freq: Once | INTRAVENOUS | Status: AC
  Administered 2013-12-24: 1000 mg via INTRAVENOUS
  Filled 2013-12-23: qty 8

## 2013-12-23 MED ORDER — SODIUM CHLORIDE 0.9 % IV SOLN
500.0000 mg | Freq: Once | INTRAVENOUS | Status: AC
  Administered 2013-12-23: 500 mg via INTRAVENOUS
  Filled 2013-12-23: qty 4

## 2013-12-23 NOTE — Evaluation (Signed)
Physical Therapy Evaluation Patient Details Name: Brianna ConnersWindi L Reilly MRN: [id number] DOB: 02/21/1976 Today's Date: 12/23/2013   History of Present Illness  Admitted with left side facial weakness, upper and lower extremity worsening contractions. She has also had been having dysarthria for the past 24 hours.  Clinical Impression  Pt admitted with/for about problems thought to be due to MS flare.  Pt currently limited functionally due to the problems listed below.  (see problems list.)  Pt will benefit from PT to maximize function and safety to be able to get home safely with available assist of family.     Follow Up Recommendations Home health PT    Equipment Recommendations  Wheelchair (measurements PT);Wheelchair cushion (measurements PT);Other (comment) (Pts hip width is 12/13", w/c depth should be 16")    Recommendations for Other Services       Precautions / Restrictions Precautions Precautions: Fall      Mobility  Bed Mobility Overal bed mobility: Needs Assistance Bed Mobility: Supine to Sit;Sit to Supine     Supine to sit: Min guard;HOB elevated (some use of the rail) Sit to supine: Min guard   General bed mobility comments: Compensates well with R side to accomplish the task  Transfers                    Ambulation/Gait                Stairs            Wheelchair Mobility    Modified Rankin (Stroke Patients Only)       Balance Overall balance assessment: Needs assistance Sitting-balance support: Feet supported;Single extremity supported Sitting balance-Leahy Scale: Fair Sitting balance - Comments: can accept min challenge before LOB                                     Pertinent Vitals/Pain     Home Living Family/patient expects to be discharged to:: Private residence Living Arrangements: Spouse/significant other;Children Available Help at Discharge: Family;Available 24 hours/day;Available PRN/intermittently Type  of Home: Mobile home Home Access: Ramped entrance     Home Layout: One level Home Equipment:  (W/C too large to be usable)      Prior Function Level of Independence: Needs assistance   Gait / Transfers Assistance Needed: Rolls around on a skateboard so needs help with transfers           Hand Dominance        Extremity/Trunk Assessment   Upper Extremity Assessment: Defer to OT evaluation           Lower Extremity Assessment: RLE deficits/detail;LLE deficits/detail RLE Deficits / Details: WFL` LLE Deficits / Details: contractured hams and ankle with foot in supination     Communication      Cognition Arousal/Alertness: Awake/alert Behavior During Therapy: WFL for tasks assessed/performed Overall Cognitive Status: Within Functional Limits for tasks assessed                      General Comments      Exercises        Assessment/Plan    PT Assessment Patient needs continued PT services  PT Diagnosis Hemiplegia non-dominant side   PT Problem List Decreased strength;Decreased balance;Decreased mobility;Impaired tone;Decreased range of motion;Decreased coordination  PT Treatment Interventions Functional mobility training;Therapeutic activities;Balance training;Patient/family education   PT Goals (Current goals can be found in the  Care Plan section) Acute Rehab PT Goals Patient Stated Goal: be able to do as much as possible myself PT Goal Formulation: With patient Time For Goal Achievement: 12/30/13 Potential to Achieve Goals: Good    Frequency Min 3X/week   Barriers to discharge Inaccessible home environment 24,2 inch wide hallways  into 24" wide bathroom door    Co-evaluation               End of Session   Activity Tolerance: Patient tolerated treatment well Patient left: in bed;with call bell/phone within reach;with family/visitor present Nurse Communication: Mobility status         Time: 1335-1415 PT Time Calculation (min):  40 min   Charges:   PT Evaluation $Initial PT Evaluation Tier I: 1 Procedure PT Treatments $Therapeutic Activity: 8-22 mins $Self Care/Home Management: 8-22   PT G CodesEliseo Gum Konner Warrior 12/23/2013, 2:41 PM 12/23/2013  [place] Bing, PT [phone]  (pager)

## 2013-12-23 NOTE — Procedures (Signed)
History: 38 year old female with a history of stroke and slurred speech/worsening symptoms  Sedation: None  Technique: This is a 17 channel routine scalp EEG performed at the bedside with bipolar and monopolar montages arranged in accordance to the international 10/20 system of electrode placement. One channel was dedicated to EKG recording.    Background: The background consists of intermixed alpha and beta activities. There is a well defined posterior dominant rhythm of 9 Hz that attenuates with eye opening.  Sleep Structures are recorded.  Photic stimulation: Physiologic driving is not performed  EEG Abnormalities: None  Clinical Interpretation: This normal EEG is recorded in the waking and sleep state. There was no seizure or seizure predisposition recorded on this study.   Ritta Slot, MD [facility] [phone]  If 7pm- 7am, please page neurology on call as listed in AMION.

## 2013-12-23 NOTE — Progress Notes (Signed)
UR complete.  Phaedra Colgate RN, MSN 

## 2013-12-23 NOTE — Progress Notes (Signed)
EEG Completed; Results Pending  

## 2013-12-23 NOTE — Progress Notes (Signed)
PATIENT DETAILS Name: [name] Age: 38 y.o. Sex: female Date of Birth: Jun 01, 1976 Admit Date: 12/22/2013 Admitting Physician Marinda Elk, MD CHE:[email or web address] M, MD  Subjective: No major complaints-feels less weakness on the left side today. Her headache is better.  Assessment/Plan: Active Problems:  Suspected Multiple sclerosis flare - Continue with IV Solu-Medrol till tomorrow, begin PT eval. - Will discuss with neurology whether or not patient requires a tapering dose of prednisone after discontinuation of IV Solu-Medrol. - May have a functional component. 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 - Not on any disease modifying agents as an outpatient, follows up with neurologist at [facility]    Syncope and collapse - Likely neurocardiogenic - Will place on telemetry for 24 hours- EKG on admission showed a normal sinus rhythm. Check Echo  History of bronchial asthma - Lungs clear, this appears stable. As needed nebulized bronchodilators.  Disposition: Remain inpatient  DVT Prophylaxis: Prophylactic Heparin  Code Status: Full code   Family Communication Husband at bedside.  Procedures:  None  CONSULTS:  neurology  Time spent 40 minutes-which includes 50% of the time with face-to-face with patient/ family and coordinating care related to the above assessment and plan.    MEDICATIONS: Scheduled Meds: . aspirin  81 mg Oral Daily  . heparin  5,000 Units Subcutaneous 3 times per day  . methylPREDNISolone (SOLU-MEDROL) injection  500 mg Intravenous Q12H  . pneumococcal 23 valent vaccine  0.5 mL Intramuscular Tomorrow-1000  . sodium chloride  3 mL Intravenous Q12H   Continuous Infusions:  PRN Meds:.sodium chloride, albuterol, ondansetron (ZOFRAN) IV, ondansetron, oxyCODONE-acetaminophen, polyethylene glycol, sodium  chloride  Antibiotics: Anti-infectives   None       PHYSICAL EXAM: Vital signs in last 24 hours: Filed Vitals:   12/22/13 1756 12/22/13 2050 12/23/13 0300 12/23/13 0516  BP: 136/83 136/92 156/113 136/88  Pulse:  104 97 95  Temp: 98.1 F (36.7 C) 98.6 F (37 C) 97.8 F (36.6 C) 98.5 F (36.9 C)  TempSrc: Oral Oral Oral Oral  Resp: 16 18  18   Height:      Weight:      SpO2: 97% 96% 99% 96%    Weight change: -0.181 kg (-6.4 oz) Filed Weights   12/22/13 0436 12/22/13 1302  Weight: 39.009 kg (86 lb) 38.828 kg (85 lb 9.6 oz)   Body mass index is 17.28 kg/(m^2).   Gen Exam: Awake and alert with slightly dysarthric speech  Neck: Supple, No JVD.   Chest: B/L Clear.   CVS: S1 S2 Regular, no murmurs.  Abdomen: soft, BS +, non tender, non distended.  Extremities: no edema, lower extremities warm to touch. 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   Wounds: N/A.    Intake/Output from previous day:  Intake/Output Summary (Last 24 hours) at 12/23/13 1202 Last data filed at 12/22/13 1700  Gross per 24 hour  Intake    120 ml  Output      0 ml  Net    120 ml     LAB RESULTS: CBC  Recent Labs Lab 12/22/13 0545 12/22/13 1349  WBC 9.9 7.4  HGB 12.3 12.4  HCT 35.4* 36.2  PLT 250 216  MCV 89.2 88.9  MCH 31.0 30.5  MCHC 34.7 34.3  RDW 14.2 14.1    Chemistries   Recent Labs Lab 12/22/13 0545 12/22/13  1349 12/23/13 0710  NA 139  --  137  K 3.5*  --  4.6  CL 104  --  104  CO2 25  --  19  GLUCOSE 80  --  139*  BUN 9  --  7  CREATININE 0.71 0.71 0.65  CALCIUM 9.3  --  9.4    CBG: No results found for this basename: GLUCAP,  in the last 168 hours  GFR Estimated Creatinine Clearance: 58.4 ml/min (by C-G formula based on Cr of 0.65).  Coagulation profile No results found for this basename: INR, PROTIME,  in the last 168 hours  Cardiac Enzymes No results found for this basename: CK, CKMB, TROPONINI, MYOGLOBIN,  in the last 168  hours  No components found with this basename: POCBNP,  No results found for this basename: DDIMER,  in the last 72 hours No results found for this basename: HGBA1C,  in the last 72 hours No results found for this basename: CHOL, HDL, LDLCALC, TRIG, CHOLHDL, LDLDIRECT,  in the last 72 hours  Recent Labs  12/22/13 1349  TSH 4.280   No results found for this basename: VITAMINB12, FOLATE, FERRITIN, TIBC, IRON, RETICCTPCT,  in the last 72 hours No results found for this basename: LIPASE, AMYLASE,  in the last 72 hours  Urine Studies No results found for this basename: UACOL, UAPR, USPG, UPH, UTP, UGL, UKET, UBIL, UHGB, UNIT, UROB, ULEU, UEPI, UWBC, URBC, UBAC, CAST, CRYS, UCOM, BILUA,  in the last 72 hours  MICROBIOLOGY: No results found for this or any previous visit (from the past 240 hour(s)).  RADIOLOGY STUDIES/RESULTS: Ct Head Wo Contrast  12/22/2013   CLINICAL DATA:  Severe headache.  EXAM: CT HEAD WITHOUT CONTRAST  TECHNIQUE: Contiguous axial images were obtained from the base of the skull through the vertex without intravenous contrast.  COMPARISON:  05/21/2012.  FINDINGS: Interval rounded area of low density in the subcortical white matter of the left parietal lobe on image 17. Interval similar area in the subcortical white matter of the posterior aspect of the right frontal lobe on image 16. Minimal patchy white matter low density elsewhere in both cerebral hemispheres. Normal size and position of the ventricles. No intracranial hemorrhage. Unremarkable bones and included paranasal sinuses.  IMPRESSION: Interval areas of white matter low density in both cerebral hemispheres. Any of these could potentially represent areas of acute ischemic change. Further evaluation with pre and postcontrast magnetic resonance imaging of the brain is recommended to exclude other causes such as infection or metastatic disease.   Electronically Signed   By: Gordan Payment M.D.   On: 12/22/2013 06:19   Mr  Laqueta Jean ZO Contrast  12/22/2013   CLINICAL DATA:  Left hemi paresis  EXAM: MRI HEAD WITHOUT AND WITH CONTRAST  TECHNIQUE: Multiplanar, multiecho pulse sequences of the brain and surrounding structures were obtained without and with intravenous contrast.  CONTRAST:  8mL MULTIHANCE GADOBENATE DIMEGLUMINE 529 MG/ML IV SOLN  COMPARISON:  CT head 12/22/2013.  MRI 08/27/2008  FINDINGS: Image quality degraded by mild motion.  Negative for acute infarct.  Hyperintensities in the white matter have progressed since 2010. There are new lesions in the right frontal white matter, left frontal white matter, and left parietal subcortical white matter. These correspond to the CT abnormalities and are most consistent with chronic ischemia. No cortical infarct. Brainstem and cerebellum are intact.  Negative for hemorrhage or mass.  Postcontrast imaging reveals normal enhancement.  IMPRESSION: No acute infarct. Progression of chronic white  matter changes since 2010.   Electronically Signed   By: Marlan Palauharles  Clark M.D.   On: 12/22/2013 10:32    Cari Vandeberg Levora DredgeM Jesica Goheen, MD  [facility] Pager:[phone]  If 7PM-7AM, please contact night-coverage [email or web address] Password [email or web address] 12/23/2013, 12:02 PM   LOS: 1 day   **Disclaimer: This note may have been dictated with voice recognition software. Similar sounding words can inadvertently be transcribed and this note may contain transcription errors which may not have been corrected upon publication of note.**

## 2013-12-23 NOTE — Progress Notes (Addendum)
Subjective: Patient reports some improvement  Exam: Filed Vitals:   12/23/13 1733  BP: 135/93  Pulse: 94  Temp: 98.1 F (36.7 C)  Resp: 18   Gen: In bed, NAD MS: Awake, alert, her speech pattern is less of a dysarthria and more of a nonphysiological speech pattern. CN: Facial droop is markedly inconsistent today.  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 Though she carries the diagnosis of multiple sclerosis, reported by the patient, I'm not all certain of the diagnosis. Her white matter changes could be related to her smoking.  Given that she reports some benefit with the steroids, I don't think it is unreasonable to finish her course tomorrow.  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  Recommendations: 1) continue ASA 2) I will arrange for her to receive her last dose steroids tomorrow morning. 3) can be d/c after last steroid dose without taper.   Ritta Slot, MD [facility] [phone]  If 7pm- 7am, please page neurology on call as listed in AMION.

## 2013-12-24 MED ORDER — OXYCODONE-ACETAMINOPHEN 5-325 MG PO TABS: 1.0000 | ORAL_TABLET | Freq: Four times a day (QID) | ORAL | Status: DC | PRN

## 2013-12-24 NOTE — Progress Notes (Signed)
Subjective: Received final dose of Solumedrol at 1030. She states she feels better.  No SE or complications.   Objective: Current vital signs: BP 138/93  Pulse 82  Temp(Src) 98.2 F (36.8 C) (Oral)  Resp 18  Ht 4\' 11"  (1.499 m)  Wt 38.828 kg (85 lb 9.6 oz)  BMI 17.28 kg/m2  SpO2 99% Vital signs in last 24 hours: Temp:  [97.8 F (36.6 C)-98.2 F (36.8 C)] 98.2 F (36.8 C) (05/19 0543) Pulse Rate:  [82-94] 82 (05/19 0543) Resp:  [18] 18 (05/19 0543) BP: (134-141)/(93-99) 138/93 mmHg (05/19 0543) SpO2:  [98 %-100 %] 99 % (05/19 0543)  Intake/Output from previous day: 05/18 0701 - 05/19 0700 In: 782 [P.O.:620; IV Piggyback:162] Out: -  Intake/Output this shift: Total I/O In: 480 [P.O.:480] Out: -  Nutritional status: General  Neurologic Exam: Mental Status: Alert, oriented, thought content appropriate.  Speech nonphysiological evidence of aphasia.  Able to follow 3 step commands without difficulty. Cranial Nerves: II: ; Visual fields grossly normal, pupils equal, round, reactive to light and accommodation III,IV, VI: ptosis not present --holding her left eyelid closed, extra-ocular motions intact bilaterally V,VII: smile asymmetric on the right, facial light touch sensation normal bilaterally VIII: hearing normal bilaterally   Motor: Right arm and leg 5/5 and left shows spastic hemiparesis Sensory: Pinprick and light touch decreased on the left Deep Tendon Reflexes:  3+ throughout    Lab Results: Basic Metabolic Panel:  Recent Labs Lab 12/22/13 0545 12/22/13 1349 12/23/13 0710  NA 139  --  137  K 3.5*  --  4.6  CL 104  --  104  CO2 25  --  19  GLUCOSE 80  --  139*  BUN 9  --  7  CREATININE 0.71 0.71 0.65  CALCIUM 9.3  --  9.4    Liver Function Tests: No results found for this basename: AST, ALT, ALKPHOS, BILITOT, PROT, ALBUMIN,  in the last 168 hours No results found for this basename: LIPASE, AMYLASE,  in the last 168 hours No results found for  this basename: AMMONIA,  in the last 168 hours  CBC:  Recent Labs Lab 12/22/13 0545 12/22/13 1349  WBC 9.9 7.4  HGB 12.3 12.4  HCT 35.4* 36.2  MCV 89.2 88.9  PLT 250 216    Cardiac Enzymes: No results found for this basename: CKTOTAL, CKMB, CKMBINDEX, TROPONINI,  in the last 168 hours  Lipid Panel: No results found for this basename: CHOL, TRIG, HDL, CHOLHDL, VLDL, LDLCALC,  in the last 168 hours  CBG: No results found for this basename: GLUCAP,  in the last 168 hours  Microbiology: Results for orders placed during the hospital encounter of 11/17/13  URINE CULTURE     Status: None   Collection Time    11/17/13  4:37 AM      Result Value Ref Range Status   Specimen Description URINE, CLEAN CATCH   Final   Special Requests NONE   Final   Culture  Setup Time     Final   Value: 11/17/2013 21:51     Performed at [facility] Count     Final   Value: >=100,000 COLONIES/ML     Performed at [facility]   Culture     Final   Value: ESCHERICHIA COLI     Performed at [facility]   Report Status 11/19/2013 FINAL   Final   Organism ID, Bacteria ESCHERICHIA COLI   Final  Coagulation Studies: No results found for this basename: LABPROT, INR,  in the last 72 hours  Imaging: No results found.  Medications:  Scheduled: . aspirin  81 mg Oral Daily  . heparin  5,000 Units Subcutaneous 3 times per day  . methylPREDNISolone (SOLU-MEDROL) injection  1,000 mg Intravenous Once  . sodium chloride  3 mL Intravenous Q12H   EEG: Clinical Interpretation: This normal EEG is recorded in the waking and sleep state. There was no seizure or seizure predisposition recorded on this study.  Assessment/Plan:  38 YO female with numbness and tingling of left side,  Back to baseline after 3 doses solumedrol.  Patient holds diagnosis of MS which is unclear. Exam shows multiple inconsistencies including facial droop and speech.  No further recommendations.   After final solumedrol may D/C without taper of steroids.       Felicie MornDavid Amier Hoyt PA-C [facility] [phone]  12/24/2013, 10:58 AM

## 2013-12-24 NOTE — Care Management Note (Addendum)
    Page 1 of 2   12/25/2013     8:42:52 AM CARE MANAGEMENT NOTE 12/25/2013  Patient:  [name], [name]   Account Number:  [id number]  Date Initiated:  12/23/2013  Documentation initiated by:  Lorne Skeens  Subjective/Objective Assessment:   patient was admitted with left sided weakness, dizziness, headache due to MS flare.  Lives at home with husband     Action/Plan:   Will follow for discharge needs pending PT/OT evals and physician orders.   Anticipated DC Date:  12/24/2013   Anticipated DC Plan:  [place]  CM consult      Choice offered to / List presented to:  C-1 Patient   DME arranged  3-N-1  [place]      DME agency  [place].        Status of service:  Completed, signed off Medicare Important Message given?   (If response is "NO", the following Medicare IM given date fields will be blank) Date Medicare IM given:   Date Additional Medicare IM given:    Discharge Disposition:  HOME/SELF CARE  Per UR Regulation:  Reviewed for med. necessity/level of care/duration of stay  If discussed at [place] of Stay Meetings, dates discussed:    Comments:  12/25/13 [place] RN, MSN, CM- Spoke with patient and husband, who state that the wheelchair that was delivered will not fit down the hallway in their home.  CM explained that this was the smallest wheelchair that could be delivered to the hospital prior to discharge.  CM spoke extensively with 5 different DME companies in the area regarding whether a wheelchair could be ordered that would meet the patient's needs.  CM found that many DME companies do not have the certification required by Medicaid to do custom orders.  CM spoke with [facility] [facility] company [phone], who states that they do have the certification, but it is approximately a 3 month process requiring a series of orders to obtain Medicaid approval. Per [facility], this  process must start with the patient's PCP. It requires a fitting, a PT eval to document need, and a home visit to identify specific needs/barriers to a traditional wheelchair.  Patient's husband was provided the phone number to pass along to the PCP to initiate the process.  Patient and family verbalized understanding and were appreciative of assistance.  12/24/13 [place], MSN, CM- Met with patient and husband to discuss discharge needs. Patient and husband are aware that [facility] is not covered by Medicaid due to patient's diaqnosis.  They are agreeable to outpatient therapy and have requested [facility], which patient has used in the past.  Patient has recieved her tub bench and 3N1 from [facility] and is awaiting the delivery of her wheelchair prior to discharge.  RN updated.

## 2013-12-24 NOTE — Evaluation (Signed)
Occupational Therapy Evaluation and Discharge Patient Details Name: [name] MRN: [id number] DOB: November 02, 1975 Today's Date: 12/24/2013    History of Present Illness Admitted with left side facial weakness, upper and lower extremity worsening contractions. She has also had been having dysarthria for the past 24 hours.   Clinical Impression   This 38 yo female admitted with above presents to acute OT with increasing contractures in LUE since 2006, dysarthric speech, and basically dependent in BADLs pta. Pt given a palm guard for left hand, encourage her and her husband to continue to move LUE to help keep ROM, and have had CM order a tub bench and 3n1 to ease burden of care on husband. No further OT needs, we will sign off.    Follow Up Recommendations  No OT follow up    Equipment Recommendations  Tub/shower bench;3 in 1 bedside comode       Precautions / Restrictions Precautions Precautions: Fall              ADL                                         General ADL Comments: Pt reports that family do all of her BADLs for her               Pertinent Vitals/Pain No c/o pain     Hand Dominance Right   Extremity/Trunk Assessment Upper Extremity Assessment Upper Extremity Assessment: LUE deficits/detail LUE Deficits / Details: increased tone and contractures which all started in 2006. I can get her digits fully extendend and her elbow flexed with increased time. I cannot get her wrist extended fully due to contractures and pt cannot feel me trying to  extend her wrist. I did supply her with a palm guard and advised her and husband (who was in the room) to monitor thumb web space area and  wrist area for any reddened areas that do not  go away when palm guard is removed for 20 minutes. LUE Sensation: decreased light touch;decreased proprioception LUE Coordination: decreased fine motor;decreased gross motor           Communication  Communication Communication: Expressive difficulties (dysarthric (pt states since 2006--maybe worse not?))   Cognition Arousal/Alertness: Awake/alert Behavior During Therapy: WFL for tasks assessed/performed Overall Cognitive Status: Within Functional Limits for tasks assessed                                Home Living Family/patient expects to be discharged to:: Private residence Living Arrangements: Spouse/significant other;Children Available Help at Discharge: Family;Available 24 hours/day;Available PRN/intermittently Type of Home: Mobile home Home Access: Ramped entrance     Home Layout: One level     Bathroom Shower/Tub: Tub/shower unit;Curtain Shower/tub characteristics: Engineer, building services: Standard                Prior Functioning/Environment Level of Independence: Needs assistance  Gait / Transfers Assistance Needed: Rolls around on a skateboard so needs help with transfers ADL's / Homemaking Assistance Needed: Pt reports that family A her will all B/D/grooming and toileting                 OT Goals(Current goals can be found in the care plan section) Acute Rehab OT Goals Patient Stated Goal: Home today  OT Frequency:  End of Session    Activity Tolerance: Patient tolerated treatment well Patient left: in bed;with call bell/phone within reach;with family/visitor present   Time: [id number] OT Time Calculation (min): 15 min Charges:  OT General Charges $OT Visit: 1 Procedure OT Evaluation $Initial OT Evaluation Tier I: 1 Procedure OT Treatments $Self Care/Home Management : 8-22 mins  Evette GeorgesCatherine Eva Krystol Rocco [phone] 12/24/2013, 10:06 AM

## 2013-12-24 NOTE — Discharge Summary (Signed)
PATIENT DETAILS Name: [name] Age: 38 y.o. Sex: female Date of Birth: 03-Sep-1975 MRN: [id number]. Admit Date: 12/22/2013 Admitting Physician: Marinda Elk, MD [id number] Judie Petit, MD  Recommendations for Outpatient Follow-up:  1. Needs follow up with Neuro-Dr [place].  PRIMARY DISCHARGE DIAGNOSIS:  Active Problems:   Multiple sclerosis   Syncope and collapse      PAST MEDICAL HISTORY: Past Medical History  Diagnosis Date  . MS (multiple sclerosis)   . Stroke   . Anxiety   . Bulging disc     DISCHARGE MEDICATIONS:   Medication List         albuterol 108 (90 BASE) MCG/ACT inhaler  Commonly known as:  PROVENTIL HFA;VENTOLIN HFA  Inhale 2 puffs into the lungs every 6 (six) hours as needed (asthma). Asthma.     albuterol (2.5 MG/3ML) 0.083% nebulizer solution  Commonly known as:  PROVENTIL  Take 2.5 mg by nebulization every 6 (six) hours as needed (asthma). Asthma.     aspirin 81 MG chewable tablet  Chew 81 mg by mouth daily.     CENTRUM ULTRA WOMENS Tabs  Take 1 tablet by mouth daily.     oxyCODONE-acetaminophen 5-325 MG per tablet  Commonly known as:  PERCOCET/ROXICET  Take 1 tablet by mouth every 6 (six) hours as needed for severe pain.        ALLERGIES:   Allergies  Allergen Reactions  . Naproxen Shortness Of Breath  . Codeine Hives  . Penicillins Swelling  . Sulfa Antibiotics Itching    BRIEF HPI:  See H&P, Labs, Consult and Test reports for all details in brief,[name] is a 38 y.o. female with a history of MS and stroke who presents with syncope followed by headache. 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 She also claimed to have approximately 20 spells of syncope followed by headache and worsening of her previous MS deficits   CONSULTATIONS:   neurology  PERTINENT RADIOLOGIC STUDIES: Ct Head Wo Contrast  12/22/2013   CLINICAL DATA:  Severe headache.  EXAM: CT HEAD WITHOUT CONTRAST   TECHNIQUE: Contiguous axial images were obtained from the base of the skull through the vertex without intravenous contrast.  COMPARISON:  05/21/2012.  FINDINGS: Interval rounded area of low density in the subcortical white matter of the left parietal lobe on image 17. Interval similar area in the subcortical white matter of the posterior aspect of the right frontal lobe on image 16. Minimal patchy white matter low density elsewhere in both cerebral hemispheres. Normal size and position of the ventricles. No intracranial hemorrhage. Unremarkable bones and included paranasal sinuses.  IMPRESSION: Interval areas of white matter low density in both cerebral hemispheres. Any of these could potentially represent areas of acute ischemic change. Further evaluation with pre and postcontrast magnetic resonance imaging of the brain is recommended to exclude other causes such as infection or metastatic disease.   Electronically Signed   By: Gordan Payment M.D.   On: 12/22/2013 06:19   Mr Laqueta Jean YB Contrast  12/22/2013   CLINICAL DATA:  Left hemi paresis  EXAM: MRI HEAD WITHOUT AND WITH CONTRAST  TECHNIQUE: Multiplanar, multiecho pulse sequences of the brain and surrounding structures were obtained without and with intravenous contrast.  CONTRAST:  3mL MULTIHANCE GADOBENATE DIMEGLUMINE 529 MG/ML IV SOLN  COMPARISON:  CT head 12/22/2013.  MRI 08/27/2008  FINDINGS: Image quality degraded by mild motion.  Negative for acute infarct.  Hyperintensities in the  white matter have progressed since 2010. There are new lesions in the right frontal white matter, left frontal white matter, and left parietal subcortical white matter. These correspond to the CT abnormalities and are most consistent with chronic ischemia. No cortical infarct. Brainstem and cerebellum are intact.  Negative for hemorrhage or mass.  Postcontrast imaging reveals normal enhancement.  IMPRESSION: No acute infarct. Progression of chronic white matter changes since  2010.   Electronically Signed   By: Marlan Palau M.D.   On: 12/22/2013 10:32     PERTINENT LAB RESULTS: CBC:  Recent Labs  12/22/13 0545 12/22/13 1349  WBC 9.9 7.4  HGB 12.3 12.4  HCT 35.4* 36.2  PLT 250 216   CMET CMP     Component Value Date/Time   NA 137 12/23/2013 0710   K 4.6 12/23/2013 0710   CL 104 12/23/2013 0710   CO2 19 12/23/2013 0710   GLUCOSE 139* 12/23/2013 0710   BUN 7 12/23/2013 0710   CREATININE 0.65 12/23/2013 0710   CALCIUM 9.4 12/23/2013 0710   PROT 6.8 01/19/2010 1300   ALBUMIN 3.7 01/19/2010 1300   AST 20 01/19/2010 1300   ALT 13 01/19/2010 1300   ALKPHOS 62 01/19/2010 1300   BILITOT 0.4 01/19/2010 1300   GFRNONAA >90 12/23/2013 0710   GFRAA >90 12/23/2013 0710    GFR Estimated Creatinine Clearance: 58.4 ml/min (by C-G formula based on Cr of 0.65). No results found for this basename: LIPASE, AMYLASE,  in the last 72 hours No results found for this basename: CKTOTAL, CKMB, CKMBINDEX, TROPONINI,  in the last 72 hours No components found with this basename: POCBNP,  No results found for this basename: DDIMER,  in the last 72 hours No results found for this basename: HGBA1C,  in the last 72 hours No results found for this basename: CHOL, HDL, LDLCALC, TRIG, CHOLHDL, LDLDIRECT,  in the last 72 hours  Recent Labs  12/22/13 1349  TSH 4.280   No results found for this basename: VITAMINB12, FOLATE, FERRITIN, TIBC, IRON, RETICCTPCT,  in the last 72 hours Coags: No results found for this basename: PT, INR,  in the last 72 hours Microbiology: No results found for this or any previous visit (from the past 240 hour(s)).   BRIEF HOSPITAL COURSE:   Active Problems: Suspected Multiple sclerosis flare -admitted and seen by Neuro, given her hx of MS-she was empirically started on IV Solumedrol. MRI Brain showed progressive white matter changes. Neuro felt that there was some functional component as well. She has now completed 2 days of IV Solumedrol, neuro does not  recommend any further steroids on discharge. She feels better and close to baseline, and is being discharged home in a stable manner. Case Management will make make appropriate outpatient PT arrangements.    Syncope and collapse -likely functional-telemetry unremarkable, MRI brain neg for acute abnormalities, EEG negative. Suspect no further work up required.  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   History of bronchial asthma  - Lungs clear, this appears stable  TODAY-DAY OF DISCHARGE:  Subjective:   [name] today has no headache,no chest abdominal pain,no new weakness tingling or numbness, feels much better wants to go home today.   Objective:   Blood pressure 138/93, pulse 82, temperature 98.2 F (36.8 C), temperature source Oral, resp. rate 18, height 4\' 11"  (1.499 m), weight 38.828 kg (85 lb 9.6 oz), SpO2 99.00%.  Intake/Output Summary (Last 24 hours)  at 12/24/13 1117 Last data filed at 12/24/13 0826  Gross per 24 hour  Intake    902 ml  Output      0 ml  Net    902 ml   Filed Weights   12/22/13 0436 12/22/13 1302  Weight: 39.009 kg (86 lb) 38.828 kg (85 lb 9.6 oz)    Exam Awake Alert, Oriented *3, No new F.N deficits, Normal affect [place].AT,PERRAL Supple Neck,No JVD, No cervical lymphadenopathy appriciated.  Symmetrical Chest wall movement, Good air movement bilaterally, CTAB RRR,No Gallops,Rubs or new Murmurs, No Parasternal Heave +ve B.Sounds, Abd Soft, Non tender, No organomegaly appriciated, No rebound -guarding or rigidity. No Cyanosis, Clubbing or edema, No new Rash or bruise  DISCHARGE CONDITION: Stable  DISPOSITION: Home with home health services  DISCHARGE INSTRUCTIONS:    Activity:  As tolerated with Full fall precautions use walker/cane & assistance as needed  Diet recommendation: Regular Diet       Discharge Instructions   Call MD for:  extreme fatigue    Complete  by:  As directed      Call MD for:  persistant dizziness or light-headedness    Complete by:  As directed      Diet - low sodium heart healthy    Complete by:  As directed      Increase activity slowly    Complete by:  As directed            Follow-up Information   Follow up with Isabella StallingNDIEGO,RICHARD M, MD. Schedule an appointment as soon as possible for a visit in 2 days.   Specialty:  Internal Medicine   Contact information:   [address] [place] [address] [phone]       [facility]. Schedule an appointment as soon as possible for a visit in 1 week.   Specialty:  Neurology   Contact information:   [address] [place] [address] [phone]       Total Time spent on discharge equals 45 minutes.  Signed: Werner LeanShanker M Dajah Fischman 12/24/2013 11:17 AM  **Disclaimer: This note may have been dictated with voice recognition software. Similar sounding words can inadvertently be transcribed and this note may contain transcription errors which may not have been corrected upon publication of note.**

## 2013-12-24 NOTE — Progress Notes (Signed)
Patient wheelchair delivered to room, called [facility] and per Toni Amend, patient is ready for DC from her standpoint. IV and tele DC'd

## 2013-12-24 NOTE — Progress Notes (Signed)
Discharge update: [facility] attempting to locate a pediatric sized wheelchair for patient instead of what was delivered

## 2013-12-26 ENCOUNTER — Emergency Department (HOSPITAL_COMMUNITY)
Admission: EM | Admit: 2013-12-26 | Discharge: 2013-12-26 | Disposition: A | Discharge status: Home / Self Care | Payer: Medicaid Other | Attending: Emergency Medicine | Admitting: Emergency Medicine

## 2013-12-26 ENCOUNTER — Encounter (HOSPITAL_COMMUNITY): Payer: Self-pay | Admitting: Emergency Medicine

## 2013-12-26 DIAGNOSIS — Z880 Allergy status to penicillin: Secondary | ICD-10-CM | POA: Insufficient documentation

## 2013-12-26 DIAGNOSIS — Z79899 Other long term (current) drug therapy: Secondary | ICD-10-CM | POA: Insufficient documentation

## 2013-12-26 DIAGNOSIS — Z8673 Personal history of transient ischemic attack (TIA), and cerebral infarction without residual deficits: Secondary | ICD-10-CM | POA: Insufficient documentation

## 2013-12-26 DIAGNOSIS — N390 Urinary tract infection, site not specified: Secondary | ICD-10-CM | POA: Insufficient documentation

## 2013-12-26 DIAGNOSIS — R110 Nausea: Secondary | ICD-10-CM | POA: Insufficient documentation

## 2013-12-26 DIAGNOSIS — F17200 Nicotine dependence, unspecified, uncomplicated: Secondary | ICD-10-CM | POA: Insufficient documentation

## 2013-12-26 DIAGNOSIS — Z7982 Long term (current) use of aspirin: Secondary | ICD-10-CM | POA: Insufficient documentation

## 2013-12-26 DIAGNOSIS — Z8669 Personal history of other diseases of the nervous system and sense organs: Secondary | ICD-10-CM | POA: Insufficient documentation

## 2013-12-26 DIAGNOSIS — Z8659 Personal history of other mental and behavioral disorders: Secondary | ICD-10-CM | POA: Insufficient documentation

## 2013-12-26 HISTORY — DX: Headache: R51

## 2013-12-26 HISTORY — DX: Syncope and collapse: R55

## 2013-12-26 HISTORY — DX: Headache, unspecified: R51.9

## 2013-12-26 LAB — COMPREHENSIVE METABOLIC PANEL
ALT: 24 U/L (ref 0–35)
AST: 26 U/L (ref 0–37)
Albumin: 3.8 g/dL (ref 3.5–5.2)
Alkaline Phosphatase: 72 U/L (ref 39–117)
BUN: 10 mg/dL (ref 6–23)
CO2: 31 mEq/L (ref 19–32)
Calcium: 9.7 mg/dL (ref 8.4–10.5)
Chloride: 92 mEq/L — ABNORMAL LOW (ref 96–112)
Creatinine, Ser: 0.64 mg/dL (ref 0.50–1.10)
GFR calc Af Amer: >90 mL/min (ref >90)
GFR calc non Af Amer: >90 mL/min (ref >90)
Glucose, Bld: 94 mg/dL (ref 70–99)
Potassium: 3.1 mEq/L — ABNORMAL LOW (ref 3.7–5.3)
Sodium: 136 mEq/L — ABNORMAL LOW (ref 137–147)
Total Bilirubin: 0.3 mg/dL (ref 0.3–1.2)
Total Protein: 7.2 g/dL (ref 6.0–8.3)

## 2013-12-26 LAB — URINALYSIS, ROUTINE W REFLEX MICROSCOPIC
Bilirubin Urine: NEGATIVE
Glucose, UA: NEGATIVE mg/dL
Ketones, ur: NEGATIVE mg/dL
Nitrite: POSITIVE — AB
Protein, ur: 100 mg/dL — AB
Specific Gravity, Urine: 1.015 (ref 1.005–1.030)
Urobilinogen, UA: 0.2 mg/dL (ref 0.0–1.0)
pH: 8 (ref 5.0–8.0)

## 2013-12-26 LAB — CBC WITH DIFFERENTIAL/PLATELET
Basophils Absolute: 0 10*3/uL (ref 0.0–0.1)
Basophils Relative: 0 % (ref 0–1)
Eosinophils Absolute: 0.1 10*3/uL (ref 0.0–0.7)
Eosinophils Relative: 1 % (ref 0–5)
HCT: 40.2 % (ref 36.0–46.0)
Hemoglobin: 13.5 g/dL (ref 12.0–15.0)
Lymphocytes Relative: 13 % (ref 12–46)
Lymphs Abs: 1.8 10*3/uL (ref 0.7–4.0)
MCH: 30.1 pg (ref 26.0–34.0)
MCHC: 33.6 g/dL (ref 30.0–36.0)
MCV: 89.5 fL (ref 78.0–100.0)
Monocytes Absolute: 1.1 10*3/uL — ABNORMAL HIGH (ref 0.1–1.0)
Monocytes Relative: 8 % (ref 3–12)
Neutro Abs: 10.4 10*3/uL — ABNORMAL HIGH (ref 1.7–7.7)
Neutrophils Relative %: 78 % — ABNORMAL HIGH (ref 43–77)
Platelets: 226 10*3/uL (ref 150–400)
RBC: 4.49 MIL/uL (ref 3.87–5.11)
RDW: 14 % (ref 11.5–15.5)
WBC: 13.4 10*3/uL — ABNORMAL HIGH (ref 4.0–10.5)

## 2013-12-26 LAB — URINE MICROSCOPIC-ADD ON

## 2013-12-26 MED ORDER — HYDROMORPHONE HCL PF 1 MG/ML IJ SOLN
1.0000 mg | Freq: Once | INTRAMUSCULAR | Status: AC
  Administered 2013-12-26: 1 mg via INTRAVENOUS
  Filled 2013-12-26: qty 1

## 2013-12-26 MED ORDER — HYDROMORPHONE HCL PF 1 MG/ML IJ SOLN
0.5000 mg | Freq: Once | INTRAMUSCULAR | Status: AC
  Administered 2013-12-26: 0.5 mg via INTRAVENOUS
  Filled 2013-12-26: qty 1

## 2013-12-26 MED ORDER — PROMETHAZINE HCL 25 MG PO TABS: 25.0000 mg | ORAL_TABLET | Freq: Four times a day (QID) | ORAL | Status: DC | PRN

## 2013-12-26 MED ORDER — CIPROFLOXACIN IN D5W 400 MG/200ML IV SOLN
400.0000 mg | Freq: Once | INTRAVENOUS | Status: AC
  Administered 2013-12-26: 400 mg via INTRAVENOUS
  Filled 2013-12-26: qty 200

## 2013-12-26 MED ORDER — HYDROCODONE-ACETAMINOPHEN 5-325 MG PO TABS: 1.0000 | ORAL_TABLET | Freq: Four times a day (QID) | ORAL | Status: DC | PRN

## 2013-12-26 MED ORDER — ONDANSETRON HCL 4 MG/2ML IJ SOLN
4.0000 mg | Freq: Once | INTRAMUSCULAR | Status: AC
  Administered 2013-12-26: 4 mg via INTRAVENOUS
  Filled 2013-12-26: qty 2

## 2013-12-26 MED ORDER — CIPROFLOXACIN HCL 500 MG PO TABS: 500.0000 mg | ORAL_TABLET | Freq: Two times a day (BID) | ORAL | Status: DC

## 2013-12-26 NOTE — ED Provider Notes (Signed)
CSN: [id number]     Arrival date & time 12/26/13  1643 History  This chart was scribed for Brianna Lennert, MD by Quintella Reichert, ED scribe.  This patient was seen in room APA11/APA11 and the patient's care was started at 7:25 PM.   Chief Complaint  Patient presents with  . Flank Pain    Patient is a 38 y.o. female presenting with flank pain. The history is provided by the patient and the spouse. No language interpreter was used.  Flank Pain This is a new problem. The current episode started 6 to 12 hours ago. The problem occurs constantly. The problem has been gradually worsening. Pertinent negatives include no chest pain, no abdominal pain and no headaches. Associated symptoms comments: Nausea, no vomiting, fevers or chills.. She has tried nothing for the symptoms.    HPI Comments: [name] is a 38 y.o. female who presents to the Emergency Department complaining of constant, severe, progressively-worsening right flank pain that began 11 hours ago.  Pt reports sImilar symptoms due to UTI a some time ago.  She also reports associated nausea but denies vomiting, fevers or chills.   Past Medical History  Diagnosis Date  . MS (multiple sclerosis)   . Stroke   . Anxiety   . Bulging disc   . Headache   . Syncope     Past Surgical History  Procedure Laterality Date  . Tubal ligation    . Wisdonm teeth      Family History  Problem Relation Age of Onset  . Hypertension Mother   . Diabetes Mellitus II Mother   . Heart failure Mother   . Other Father     History  Substance Use Topics  . Smoking status: Current Every Day Smoker -- 0.50 packs/day    Types: Cigarettes  . Smokeless tobacco: Not on file  . Alcohol Use: No    OB History   Grav Para Term Preterm Abortions TAB SAB Ect Mult Living                   Review of Systems  Constitutional: Negative for fever, chills, appetite change and fatigue.  HENT: Negative for congestion, ear discharge and sinus  pressure.   Eyes: Negative for discharge.  Respiratory: Negative for cough.   Cardiovascular: Negative for chest pain.  Gastrointestinal: Positive for nausea. Negative for vomiting, abdominal pain and diarrhea.  Genitourinary: Positive for flank pain. Negative for frequency and hematuria.  Musculoskeletal: Negative for back pain.  Skin: Negative for rash.  Neurological: Negative for seizures and headaches.  Psychiatric/Behavioral: Negative for hallucinations.      Allergies  Naproxen; Codeine; Penicillins; and Sulfa antibiotics  Home Medications   Prior to Admission medications   Medication Sig Start Date End Date Taking? Authorizing Provider  albuterol (PROVENTIL HFA;VENTOLIN HFA) 108 (90 BASE) MCG/ACT inhaler Inhale 2 puffs into the lungs every 6 (six) hours as needed (asthma). Asthma.    Historical Provider, MD  albuterol (PROVENTIL) (2.5 MG/3ML) 0.083% nebulizer solution Take 2.5 mg by nebulization every 6 (six) hours as needed (asthma). Asthma.    Historical Provider, MD  aspirin 81 MG chewable tablet Chew 81 mg by mouth daily.    Historical Provider, MD  Multiple Vitamins-Minerals (CENTRUM ULTRA WOMENS) TABS Take 1 tablet by mouth daily.    Historical Provider, MD  oxyCODONE-acetaminophen (PERCOCET/ROXICET) 5-325 MG per tablet Take 1 tablet by mouth every 6 (six) hours as needed for severe pain. 12/24/13   Shanker Judie Petit  Ghimire, MD   BP 154/116  Pulse 113  Temp(Src) 98 F (36.7 C) (Oral)  Resp 22  Ht 4\' 9"  (1.448 m)  Wt 85 lb (38.556 kg)  BMI 18.39 kg/m2  SpO2 100%  LMP 12/26/2013  Physical Exam  Nursing note and vitals reviewed. Constitutional: She is oriented to person, place, and time. She appears well-developed.  HENT:  Head: Normocephalic.  Eyes: Conjunctivae and EOM are normal. No scleral icterus.  Neck: Neck supple. No thyromegaly present.  Cardiovascular: Normal rate and regular rhythm.  Exam reveals no gallop and no friction rub.   No murmur  heard. Pulmonary/Chest: No stridor. She has no wheezes. She has no rales. She exhibits no tenderness.  Abdominal: She exhibits no distension. There is tenderness. There is no rebound.  Moderate right flank tenderness  Musculoskeletal: Normal range of motion. She exhibits no edema.  Lymphadenopathy:    She has no cervical adenopathy.  Neurological: She is oriented to person, place, and time. She exhibits normal muscle tone. Coordination normal.  Skin: No rash noted. No erythema.  Psychiatric: She has a normal mood and affect. Her behavior is normal.    ED Course  Procedures (including critical care time)  DIAGNOSTIC STUDIES: Oxygen Saturation is 100% on room air, normal by my interpretation.    COORDINATION OF CARE: 7:27 PM-Discussed treatment plan which includes pain medication, anti-emetics, and labs with pt at bedside and pt agreed to plan.     Labs Review Labs Reviewed  URINALYSIS, ROUTINE W REFLEX MICROSCOPIC - Abnormal; Notable for the following:    Color, Urine STRAW (*)    Hgb urine dipstick LARGE (*)    Protein, ur 100 (*)    Nitrite POSITIVE (*)    Leukocytes, UA SMALL (*)    All other components within normal limits  CBC WITH DIFFERENTIAL - Abnormal; Notable for the following:    WBC 13.4 (*)    Neutrophils Relative % 78 (*)    Neutro Abs 10.4 (*)    Monocytes Absolute 1.1 (*)    All other components within normal limits  COMPREHENSIVE METABOLIC PANEL - Abnormal; Notable for the following:    Sodium 136 (*)    Potassium 3.1 (*)    Chloride 92 (*)    All other components within normal limits  URINE MICROSCOPIC-ADD ON - Abnormal; Notable for the following:    Bacteria, UA MANY (*)    All other components within normal limits    Imaging Review No results found.    EKG Interpretation None      MDM   Final diagnoses:  None    The chart was scribed for me under my direct supervision.  I personally performed the history, physical, and medical decision  making and all procedures in the evaluation of this patient.. nt}    Brianna LennertJoseph L Tayt Moyers, MD 12/26/13 2222

## 2013-12-26 NOTE — Discharge Instructions (Signed)
Drink plenty of fluids and follow up with your md when you finish the medicine

## 2013-12-26 NOTE — ED Notes (Signed)
Pain rt flank all day.  Recent  Adm at [facility] with MS flare.

## 2013-12-26 NOTE — ED Notes (Signed)
MD aware of blood pressure

## 2014-01-06 ENCOUNTER — Ambulatory Visit (HOSPITAL_COMMUNITY): Admit: 2014-01-06 | Payer: Medicaid Other

## 2014-01-22 ENCOUNTER — Ambulatory Visit (HOSPITAL_COMMUNITY): Admission: RE | Admit: 2014-01-22 | Payer: Medicaid Other | Source: Ambulatory Visit

## 2014-02-05 ENCOUNTER — Ambulatory Visit (HOSPITAL_COMMUNITY)
Admission: RE | Admit: 2014-02-05 | Discharge: 2014-02-05 | Disposition: A | Discharge status: Home / Self Care | Payer: Medicaid Other | Source: Ambulatory Visit | Attending: Family Medicine | Admitting: Family Medicine

## 2014-02-05 DIAGNOSIS — IMO0001 Reserved for inherently not codable concepts without codable children: Secondary | ICD-10-CM | POA: Diagnosis present

## 2014-02-05 DIAGNOSIS — M6250 Muscle wasting and atrophy, not elsewhere classified, unspecified site: Secondary | ICD-10-CM | POA: Diagnosis not present

## 2014-02-05 DIAGNOSIS — G35 Multiple sclerosis: Secondary | ICD-10-CM | POA: Insufficient documentation

## 2014-02-05 DIAGNOSIS — M6289 Other specified disorders of muscle: Secondary | ICD-10-CM | POA: Insufficient documentation

## 2014-02-05 DIAGNOSIS — Z8673 Personal history of transient ischemic attack (TIA), and cerebral infarction without residual deficits: Secondary | ICD-10-CM | POA: Diagnosis not present

## 2014-02-05 NOTE — Evaluation (Signed)
Physical Therapy Evaluation  Patient Details  Name: [name] MRN: [id number] Date of Birth: 15-Jun-1976  Today's Date: 02/05/2014 Time: 1530 - 1600                Visit#: 1 of 1  Assessment Diagnosis: Brianna/ Rt CVA   Past Medical History:  Past Medical History  Diagnosis Date  . Brianna (multiple sclerosis)   . Stroke   . Anxiety   . Bulging disc   . Headache   . Syncope    Past Surgical History:  Past Surgical History  Procedure Laterality Date  . Tubal ligation    . Wisdonm teeth      Subjective Symptoms/Limitations Symptoms: [name] states that she had a stroke in 2006 and then  was dx with Brianna 2012 .  She has not walked since 2006.  She states that she needs assistance to transfer.  She states that she gets around her house on a skateboard.   Pertinent History: Pt has Brianna as well as hx of Rt CVA and has been none ambulatory secondary to her stroke.  How long can you sit comfortably?: no problem How long can you stand comfortably?: does not stand How long can you walk comfortably?: does not walk  Pain Assessment Currently in Pain?: No/denies Prior Functioning   Pt has not been ambulatory since 2006  Assessment RLE Strength Right Knee Extension: 5/5 Right Ankle Dorsiflexion: 5/5 Right Ankle Plantar Flexion: 5/5 LLE Assessment LLE Assessment:  (Pt has increased tone; Lt foot inverted; Hamstring contractu) LLE Strength Left Hip Flexion: 1/5 Left Hip Extension: 1/5 Left Hip ABduction: 1/5 Left Knee Flexion: 1/5 Left Knee Extension: 0/5 Left Ankle Dorsiflexion: 0/5 Left Ankle Plantar Flexion: 0/5  Exercise/Treatments Mobility/Balance  Transfers Transfers: Editor, commissioning Transfers: 5: Supervision   PROM to Lt LE    Physical Therapy Assessment and Plan PT Assessment and Plan Clinical Impression Statement: Pt is a 38 yo female who has had a hx of a CVA as well as muscular sclerosis. She had therapy for months after her stroke but due  to increased tone she was never able to ambulate.  She has been referred to therapy but unfortunately she is at prior functional level and prognosis of any advancement is very poor.   She will not be picked up for therapy . PT Plan: 1 time treatement    Goals Home Exercise Program Pt/caregiver will Perform Home Exercise Program: For increased ROM  Problem List Patient Active Problem List   Diagnosis Date Noted  . Muscle tone atonic 02/05/2014  . Multiple sclerosis 12/22/2013  . 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  Family and pt were shown how to complete ROM with slow rotation to increase the amount of motion they are able to achieve.   GP    RUSSELL,CINDY 02/05/2014, 4:18 PM  Physician Documentation Your signature is required to indicate approval of the treatment plan as stated above.  Please sign and either send electronically or make a copy of this report for your files and return this physician signed original.   Please mark one 1.__approve of plan  2. ___approve of plan with the following conditions.   ______________________________  _____________________ Physician Signature                                                                                                             Date

## 2014-04-17 NOTE — Addendum Note (Signed)
Encounter addended by: Jeanene Erb, OT on: 04/17/2014  1:47 PM<BR>     Documentation filed: Episodes

## 2014-05-25 ENCOUNTER — Inpatient Hospital Stay (HOSPITAL_COMMUNITY)
Admission: EM | Admit: 2014-05-25 | Discharge: 2014-05-27 | DRG: 058 | Disposition: A | Discharge status: Home / Self Care | Payer: Medicaid Other | Attending: Internal Medicine | Admitting: Internal Medicine

## 2014-05-25 ENCOUNTER — Emergency Department (HOSPITAL_COMMUNITY): Payer: Medicaid Other

## 2014-05-25 ENCOUNTER — Encounter (HOSPITAL_COMMUNITY): Payer: Self-pay | Admitting: Emergency Medicine

## 2014-05-25 DIAGNOSIS — J45909 Unspecified asthma, uncomplicated: Secondary | ICD-10-CM | POA: Diagnosis present

## 2014-05-25 DIAGNOSIS — G8110 Spastic hemiplegia affecting unspecified side: Secondary | ICD-10-CM | POA: Diagnosis present

## 2014-05-25 DIAGNOSIS — I10 Essential (primary) hypertension: Secondary | ICD-10-CM | POA: Diagnosis present

## 2014-05-25 DIAGNOSIS — F17210 Nicotine dependence, cigarettes, uncomplicated: Secondary | ICD-10-CM | POA: Diagnosis present

## 2014-05-25 DIAGNOSIS — I161 Hypertensive emergency: Secondary | ICD-10-CM

## 2014-05-25 DIAGNOSIS — R4781 Slurred speech: Secondary | ICD-10-CM | POA: Diagnosis present

## 2014-05-25 DIAGNOSIS — Z8673 Personal history of transient ischemic attack (TIA), and cerebral infarction without residual deficits: Secondary | ICD-10-CM

## 2014-05-25 DIAGNOSIS — F419 Anxiety disorder, unspecified: Secondary | ICD-10-CM | POA: Diagnosis present

## 2014-05-25 DIAGNOSIS — R471 Dysarthria and anarthria: Secondary | ICD-10-CM | POA: Diagnosis present

## 2014-05-25 DIAGNOSIS — Z79899 Other long term (current) drug therapy: Secondary | ICD-10-CM

## 2014-05-25 DIAGNOSIS — G35 Multiple sclerosis: Secondary | ICD-10-CM | POA: Diagnosis not present

## 2014-05-25 DIAGNOSIS — N390 Urinary tract infection, site not specified: Secondary | ICD-10-CM | POA: Diagnosis present

## 2014-05-25 DIAGNOSIS — Z880 Allergy status to penicillin: Secondary | ICD-10-CM | POA: Diagnosis not present

## 2014-05-25 DIAGNOSIS — M62422 Contracture of muscle, left upper arm: Secondary | ICD-10-CM

## 2014-05-25 DIAGNOSIS — R479 Unspecified speech disturbances: Secondary | ICD-10-CM

## 2014-05-25 DIAGNOSIS — Z882 Allergy status to sulfonamides status: Secondary | ICD-10-CM

## 2014-05-25 DIAGNOSIS — R51 Headache: Secondary | ICD-10-CM

## 2014-05-25 DIAGNOSIS — M62462 Contracture of muscle, left lower leg: Secondary | ICD-10-CM

## 2014-05-25 DIAGNOSIS — R531 Weakness: Secondary | ICD-10-CM | POA: Diagnosis present

## 2014-05-25 DIAGNOSIS — I7774 Dissection of vertebral artery: Secondary | ICD-10-CM | POA: Diagnosis present

## 2014-05-25 DIAGNOSIS — Z885 Allergy status to narcotic agent status: Secondary | ICD-10-CM | POA: Diagnosis not present

## 2014-05-25 DIAGNOSIS — Z7982 Long term (current) use of aspirin: Secondary | ICD-10-CM | POA: Diagnosis not present

## 2014-05-25 DIAGNOSIS — R519 Headache, unspecified: Secondary | ICD-10-CM

## 2014-05-25 LAB — RAPID URINE DRUG SCREEN, HOSP PERFORMED
Amphetamines: NOT DETECTED
Barbiturates: NOT DETECTED
Benzodiazepines: NOT DETECTED
Cocaine: NOT DETECTED
Opiates: NOT DETECTED
Tetrahydrocannabinol: POSITIVE — AB

## 2014-05-25 LAB — URINALYSIS, ROUTINE W REFLEX MICROSCOPIC
Bilirubin Urine: NEGATIVE
Glucose, UA: NEGATIVE mg/dL
Ketones, ur: NEGATIVE mg/dL
Leukocytes, UA: NEGATIVE
Nitrite: POSITIVE — AB
Protein, ur: NEGATIVE mg/dL
Specific Gravity, Urine: 1.015 (ref 1.005–1.030)
Urobilinogen, UA: 0.2 mg/dL (ref 0.0–1.0)
pH: 6 (ref 5.0–8.0)

## 2014-05-25 LAB — URINE MICROSCOPIC-ADD ON

## 2014-05-25 LAB — CBC WITH DIFFERENTIAL/PLATELET
Basophils Absolute: 0.1 10*3/uL (ref 0.0–0.1)
Basophils Relative: 1 % (ref 0–1)
Eosinophils Absolute: 0.5 10*3/uL (ref 0.0–0.7)
Eosinophils Relative: 5 % (ref 0–5)
HCT: 41.6 % (ref 36.0–46.0)
Hemoglobin: 14.6 g/dL (ref 12.0–15.0)
Lymphocytes Relative: 37 % (ref 12–46)
Lymphs Abs: 3.9 10*3/uL (ref 0.7–4.0)
MCH: 30.4 pg (ref 26.0–34.0)
MCHC: 35.1 g/dL (ref 30.0–36.0)
MCV: 86.7 fL (ref 78.0–100.0)
Monocytes Absolute: 0.8 10*3/uL (ref 0.1–1.0)
Monocytes Relative: 7 % (ref 3–12)
Neutro Abs: 5.4 10*3/uL (ref 1.7–7.7)
Neutrophils Relative %: 50 % (ref 43–77)
Platelets: 316 10*3/uL (ref 150–400)
RBC: 4.8 MIL/uL (ref 3.87–5.11)
RDW: 13.9 % (ref 11.5–15.5)
WBC: 10.7 10*3/uL — ABNORMAL HIGH (ref 4.0–10.5)

## 2014-05-25 LAB — CBG MONITORING, ED: Glucose-Capillary: 123 mg/dL — ABNORMAL HIGH (ref 70–99)

## 2014-05-25 LAB — BASIC METABOLIC PANEL
Anion gap: 12 (ref 5–15)
BUN: 9 mg/dL (ref 6–23)
CO2: 28 mEq/L (ref 19–32)
Calcium: 9.7 mg/dL (ref 8.4–10.5)
Chloride: 95 mEq/L — ABNORMAL LOW (ref 96–112)
Creatinine, Ser: 0.73 mg/dL (ref 0.50–1.10)
GFR calc Af Amer: >90 mL/min (ref >90)
GFR calc non Af Amer: >90 mL/min (ref >90)
Glucose, Bld: 104 mg/dL — ABNORMAL HIGH (ref 70–99)
Potassium: 3.1 mEq/L — ABNORMAL LOW (ref 3.7–5.3)
Sodium: 135 mEq/L — ABNORMAL LOW (ref 137–147)

## 2014-05-25 LAB — PROTIME-INR
INR: 0.96 (ref 0.00–1.49)
Prothrombin Time: 12.9 seconds (ref 11.6–15.2)

## 2014-05-25 LAB — TROPONIN I: Troponin I: <0.3 ng/mL (ref <0.30)

## 2014-05-25 MED ORDER — SODIUM CHLORIDE 0.9 % IJ SOLN: 3.0000 mL | Freq: Two times a day (BID) | INTRAMUSCULAR | Status: DC

## 2014-05-25 MED ORDER — ASPIRIN 325 MG PO TABS
325.0000 mg | ORAL_TABLET | Freq: Every day | ORAL | Status: DC
  Administered 2014-05-26 – 2014-05-27 (×2): 325 mg via ORAL
  Filled 2014-05-25 (×2): qty 1

## 2014-05-25 MED ORDER — MORPHINE SULFATE 4 MG/ML IJ SOLN
4.0000 mg | Freq: Once | INTRAMUSCULAR | Status: AC
  Administered 2014-05-25: 4 mg via INTRAVENOUS
  Filled 2014-05-25: qty 1

## 2014-05-25 MED ORDER — MORPHINE SULFATE 4 MG/ML IJ SOLN
INTRAMUSCULAR | Status: AC
  Administered 2014-05-25: 4 mg via INTRAVENOUS
  Filled 2014-05-25: qty 1

## 2014-05-25 MED ORDER — LABETALOL HCL 5 MG/ML IV SOLN
10.0000 mg | INTRAVENOUS | Status: DC | PRN
  Administered 2014-05-25: 10 mg via INTRAVENOUS
  Filled 2014-05-25: qty 4

## 2014-05-25 MED ORDER — ACETAMINOPHEN 500 MG PO TABS
500.0000 mg | ORAL_TABLET | Freq: Four times a day (QID) | ORAL | Status: DC | PRN
  Administered 2014-05-27 (×2): 500 mg via ORAL
  Filled 2014-05-25 (×2): qty 1

## 2014-05-25 MED ORDER — HEPARIN SODIUM (PORCINE) 5000 UNIT/ML IJ SOLN
5000.0000 [IU] | Freq: Three times a day (TID) | INTRAMUSCULAR | Status: DC
  Administered 2014-05-25 – 2014-05-27 (×5): 5000 [IU] via SUBCUTANEOUS
  Filled 2014-05-25 (×5): qty 1

## 2014-05-25 MED ORDER — ALBUTEROL SULFATE (2.5 MG/3ML) 0.083% IN NEBU: 2.5000 mg | INHALATION_SOLUTION | Freq: Four times a day (QID) | RESPIRATORY_TRACT | Status: DC | PRN

## 2014-05-25 MED ORDER — SODIUM CHLORIDE 0.9 % IV SOLN
INTRAVENOUS | Status: DC
  Administered 2014-05-25: 21:00:00 via INTRAVENOUS

## 2014-05-25 MED ORDER — STROKE: EARLY STAGES OF RECOVERY BOOK
Freq: Once | Status: DC
Start: 2014-05-25 — End: 2014-05-27
  Filled 2014-05-25: qty 1

## 2014-05-25 MED ORDER — LORAZEPAM 2 MG/ML IJ SOLN
1.0000 mg | Freq: Once | INTRAMUSCULAR | Status: AC
  Administered 2014-05-25: 1 mg via INTRAVENOUS
  Filled 2014-05-25: qty 1

## 2014-05-25 MED ORDER — LABETALOL HCL 5 MG/ML IV SOLN
20.0000 mg | Freq: Once | INTRAVENOUS | Status: AC
  Administered 2014-05-25: 20 mg via INTRAVENOUS
  Filled 2014-05-25: qty 4

## 2014-05-25 MED ORDER — ASPIRIN 81 MG PO CHEW
81.0000 mg | CHEWABLE_TABLET | Freq: Every day | ORAL | Status: DC
  Filled 2014-05-25: qty 1

## 2014-05-25 MED ORDER — SODIUM CHLORIDE 0.9 % IJ SOLN: 3.0000 mL | INTRAMUSCULAR | Status: DC | PRN

## 2014-05-25 MED ORDER — MORPHINE SULFATE 4 MG/ML IJ SOLN
4.0000 mg | Freq: Once | INTRAMUSCULAR | Status: AC
  Administered 2014-05-25: 4 mg via INTRAVENOUS

## 2014-05-25 MED ORDER — ALBUTEROL SULFATE HFA 108 (90 BASE) MCG/ACT IN AERS: 2.0000 | INHALATION_SPRAY | Freq: Four times a day (QID) | RESPIRATORY_TRACT | Status: DC | PRN

## 2014-05-25 MED ORDER — HYDRALAZINE HCL 20 MG/ML IJ SOLN
10.0000 mg | Freq: Once | INTRAMUSCULAR | Status: AC
  Administered 2014-05-25: 10 mg via INTRAVENOUS
  Filled 2014-05-25: qty 1

## 2014-05-25 MED ORDER — SODIUM CHLORIDE 0.9 % IV SOLN: 250.0000 mL | INTRAVENOUS | Status: DC | PRN

## 2014-05-25 MED ORDER — ASPIRIN 300 MG RE SUPP: 300.0000 mg | Freq: Every day | RECTAL | Status: DC

## 2014-05-25 NOTE — ED Notes (Signed)
Patient's mother reports patient started complaining of severe headache tonight. Mother reports they were bringing patient to ED for severe headache, and on the way here, approximately 30 minutes ago, the patient's speech started slurring. Patient also reports her left arm is weaker than normal.

## 2014-05-25 NOTE — ED Provider Notes (Signed)
CSN: [id number]     Arrival date & time 05/25/14  2019 History  This chart was scribed for Enid Skeens, MD by Richarda Overlie, ED Scribe. This patient was seen in room APA10/APA10 and the patient's care was started 8:30 PM.    Chief Complaint  Patient presents with  . Code Stroke    The history is provided by the patient. No language interpreter was used.   HPI Comments: [name] is a 38 y.o. female with a history of MS and stroke who presents to the Emergency Department complaining of stroke that occurred 30 minutes ago. Relative reports she has associated slurred speech, new weakness on her left side that is worse than her baseline. Patient denies any head injuries, CP, SOB, abdominal pain, or recent surgeries within the past 6 weeks.      Past Medical History  Diagnosis Date  . MS (multiple sclerosis)   . Stroke   . Anxiety   . Bulging disc   . Headache   . Syncope    Past Surgical History  Procedure Laterality Date  . Tubal ligation    . Wisdonm teeth     Family History  Problem Relation Age of Onset  . Hypertension Mother   . Diabetes Mellitus II Mother   . Heart failure Mother   . Other Father    History  Substance Use Topics  . Smoking status: Current Every Day Smoker -- 0.50 packs/day    Types: Cigarettes  . Smokeless tobacco: Not on file  . Alcohol Use: No   OB History   Grav Para Term Preterm Abortions TAB SAB Ect Mult Living                 Review of Systems  Constitutional: Negative for fever and chills.  HENT: Negative for congestion.   Eyes: Negative for visual disturbance.  Respiratory: Negative for shortness of breath.   Cardiovascular: Negative for chest pain and leg swelling.  Gastrointestinal: Negative for vomiting, abdominal pain and blood in stool.  Genitourinary: Negative for dysuria and flank pain.  Musculoskeletal: Negative for back pain, neck pain and neck stiffness.  Skin: Negative for rash.  Neurological: Positive for  speech difficulty, weakness, numbness and headaches. Negative for facial asymmetry and light-headedness.     Allergies  Naproxen; Codeine; Penicillins; and Sulfa antibiotics  Home Medications   Prior to Admission medications   Medication Sig Start Date End Date Taking? Authorizing Provider  acetaminophen (TYLENOL) 500 MG tablet Take 500 mg by mouth every 6 (six) hours as needed for headache.   Yes Historical Provider, MD  albuterol (PROVENTIL HFA;VENTOLIN HFA) 108 (90 BASE) MCG/ACT inhaler Inhale 2 puffs into the lungs every 6 (six) hours as needed (asthma). Asthma.   Yes Historical Provider, MD  aspirin 81 MG chewable tablet Chew 81 mg by mouth daily.   Yes Historical Provider, MD  ibuprofen (ADVIL,MOTRIN) 200 MG tablet Take 200 mg by mouth every 6 (six) hours as needed for headache.   Yes Historical Provider, MD  Multiple Vitamins-Minerals (CENTRUM ULTRA WOMENS) TABS Take 1 tablet by mouth daily.   Yes Historical Provider, MD  albuterol (PROVENTIL) (2.5 MG/3ML) 0.083% nebulizer solution Take 2.5 mg by nebulization every 6 (six) hours as needed (asthma). Asthma.    Historical Provider, MD   BP 196/136  Pulse 121  Temp(Src) 97.8 F (36.6 C) (Oral)  Resp 20  Ht 4\' 9"  (1.448 m)  Wt 85 lb (38.556 kg)  BMI 18.39 kg/m2  SpO2 100%  Physical Exam  Nursing note and vitals reviewed. Constitutional: She is oriented to person, place, and time. She appears well-developed and well-nourished.  HENT:  Head: Normocephalic and atraumatic.  Mouth/Throat: Oropharynx is clear and moist.  Tosis on left eye   Eyes: Pupils are equal, round, and reactive to light.  Extraocular function intact  Difficult on exam in left visual field due to ptosis   Cardiovascular: Tachycardia present.   Tachycardic   Pulmonary/Chest: Effort normal. No respiratory distress.  Abdominal: Soft. There is no tenderness.  Musculoskeletal: She exhibits no edema.  Contracture of the left arm, good tone, 5+ strength flexion  left elbow. Good flexion with left wrist. No obvious right arm drift. Unable to perform left due to contraction of left arm. Decreased sensation in left arm versus right  Flexion of the left knee, mild contracture, good muscle tone No leg drift, 5+ strength in right lower extremity  Decreased sensation in left leg  Mild slurring   Neurological: She is alert and oriented to person, place, and time.  Pupils equal bilateral, extraocular muscle function intact Decreased sensation in left arm and leg versus right  Skin: Skin is warm and dry.  Psychiatric: She has a normal mood and affect.    ED Course  Procedures  DIAGNOSTIC STUDIES:  COORDINATION OF CARE: 8:39 PM Discussed treatment plan with pt at bedside and pt agreed to plan. '  Labs Review Labs Reviewed  BASIC METABOLIC PANEL - Abnormal; Notable for the following:    Sodium 135 (*)    Potassium 3.1 (*)    Chloride 95 (*)    Glucose, Bld 104 (*)    All other components within normal limits  CBC WITH DIFFERENTIAL - Abnormal; Notable for the following:    WBC 10.7 (*)    All other components within normal limits  CBG MONITORING, ED - Abnormal; Notable for the following:    Glucose-Capillary 123 (*)    All other components within normal limits  PROTIME-INR  TROPONIN I  URINALYSIS, ROUTINE W REFLEX MICROSCOPIC  URINE RAPID DRUG SCREEN (HOSP PERFORMED)    Imaging Review Ct Head Wo Contrast  05/25/2014   CLINICAL DATA:  38 year old female code stroke. Slurred speech and left upper extremity weakness beginning 30 min ago. Initial encounter. Current history multiple sclerosis.  EXAM: CT HEAD WITHOUT CONTRAST  TECHNIQUE: Contiguous axial images were obtained from the base of the skull through the vertex without intravenous contrast.  COMPARISON:  Brain MRI 12/22/2013 and earlier.  FINDINGS: Mild left sphenoid and right maxillary sinus mucosal thickening. Other Visualized paranasal sinuses and mastoids are clear. No acute osseous  abnormality identified.  Visualized orbits and scalp soft tissues are within normal limits.  Cerebral volume is normal. No midline shift, mass effect, or evidence of intracranial mass lesion. No ventriculomegaly. No evidence of cortically based acute infarction identified. No suspicious intracranial vascular hyperdensity.  Mild scattered white matter hypodensity is stable.  IMPRESSION: No acute intracranial abnormality, and negative except for chronic white matter changes.  Study discussed by telephone with Dr. Cephus Richer on 05/25/2014 at 20:55 .   Electronically Signed   By: Augusto Gamble M.D.   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 The recorded information has been reviewed and is accurate.  Patient with uncontrolled MS history presents with worsening slurred speech and left-sided symptoms. On exam patient has contractures left side which are worse than normal per report. Since patient was with him stroke time window, code stroke called. Stroke neurologist tele-psych evaluated the patient and is not feel this is an acute stroke however agrees with bring the patient hospital for further workup and blood pressure management. Labetalol ordered for tachycardia and high blood  pressure. [facility] who recommends [facility] for further evaluation. Discuss case with [facility] to help arrange transfer.  The patients results and plan were reviewed and discussed.   Any x-rays performed were personally reviewed by myself.   Differential diagnosis were considered with the presenting HPI.  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    Enid SkeensJoshua M Syona Wroblewski, MD 05/25/14 [phone]

## 2014-05-25 NOTE — ED Notes (Signed)
Vitals were obtained upon patient's arrival to Triage; however, were not entered until now.

## 2014-05-25 NOTE — H&P (Signed)
[facility] History and Physical  Brianna ConnersWindi L Reilly BMW:[id number] DOB: 07/28/1976 DOA: 05/25/2014  Referring physician: EDP PCP: Isabella StallingNDIEGO,RICHARD M, MD   Chief Complaint: Code Stroke   HPI: [name] is a 38 y.o. 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  Patient presents to ED with c/o new onset dysarthria.  Symptoms onset at 8:15 PM.  Patient has slurred speech which is new.  There is also questionable worsening of baseline L sided weakness as well.  Review of Systems: Systems reviewed.  As above, otherwise negative  Past Medical History  Diagnosis Date  . MS (multiple sclerosis)   . Stroke   . Anxiety   . Bulging disc   . Headache   . Syncope    Past Surgical History  Procedure Laterality Date  . Tubal ligation    . Wisdonm teeth     Social History:  reports that she has been smoking Cigarettes.  She has been smoking about 0.50 packs per day. She does not have any smokeless tobacco history on file. She reports that she does not drink alcohol or use illicit drugs.  Allergies  Allergen Reactions  . Naproxen Shortness Of Breath  . Codeine Hives  . Penicillins Swelling  . Sulfa Antibiotics Itching    Family History  Problem Relation Age of Onset  . Hypertension Mother   . Diabetes Mellitus II Mother   . Heart failure Mother   . Other Father      Prior to Admission medications   Medication Sig Start Date End Date Taking? Authorizing Provider  acetaminophen (TYLENOL) 500 MG tablet Take 500 mg by mouth every 6 (six) hours as needed for headache.   Yes Historical Provider, MD  albuterol (PROVENTIL HFA;VENTOLIN HFA) 108 (90 BASE) MCG/ACT inhaler Inhale 2 puffs into the lungs every 6 (six) hours as needed (asthma). Asthma.   Yes Historical Provider, MD  aspirin 81 MG chewable tablet Chew 81 mg by mouth daily.   Yes Historical Provider, MD  ibuprofen  (ADVIL,MOTRIN) 200 MG tablet Take 200 mg by mouth every 6 (six) hours as needed for headache.   Yes Historical Provider, MD  Multiple Vitamins-Minerals (CENTRUM ULTRA WOMENS) TABS Take 1 tablet by mouth daily.   Yes Historical Provider, MD  albuterol (PROVENTIL) (2.5 MG/3ML) 0.083% nebulizer solution Take 2.5 mg by nebulization every 6 (six) hours as needed (asthma). Asthma.    Historical Provider, MD   Physical Exam: Filed Vitals:   05/25/14 2200  BP: 187/137  Pulse: 99  Temp:   Resp: 20    BP 187/137  Pulse 99  Temp(Src) 98.1 F (36.7 C) (Oral)  Resp 20  Ht 4\' 9"  (1.448 m)  Wt 38.556 kg (85 lb)  BMI 18.39 kg/m2  SpO2 96%  General Appearance:    Alert, oriented, no distress, appears stated age  Head:    Normocephalic, atraumatic  Eyes:    PERRA, ptosis of left eye, EOMI sclera non-icteric     Nose:   Nares without drainage or epistaxis. Mucosa, turbinates normal  Throat:   Moist mucous membranes. Oropharynx without erythema or exudate.  Neck:   Supple. No carotid bruits.  No thyromegaly.  No lymphadenopathy.   Back:     No CVA tenderness, no spinal tenderness  Lungs:     Clear to auscultation bilaterally, without wheezes, rhonchi or rales  Chest wall:    No tenderness to  palpitation  Heart:    Tachycardic, regular, without murmurs, gallops, rubs  Abdomen:     Soft, non-tender, nondistended, normal bowel sounds, no organomegaly  Genitalia:    deferred  Rectal:    deferred  Extremities:   No clubbing, cyanosis or edema.  Pulses:   2+ and symmetric all extremities  Skin:   Skin color, texture, turgor normal, no rashes or lesions  Lymph nodes:   Cervical, supraclavicular, and axillary nodes normal  Neurologic:   Flexion contracture of the left arm and left knee.  Decreased sensation in left leg.  Mild slurring of speech, Decreased sensation in left arm.  RUE and RLE have good strength.  L sided facial droop includes ptosis.    Labs on Admission:  Basic Metabolic  Panel:  Recent Labs Lab 05/25/14 2034  NA 135*  K 3.1*  CL 95*  CO2 28  GLUCOSE 104*  BUN 9  CREATININE 0.73  CALCIUM 9.7   Liver Function Tests: No results found for this basename: AST, ALT, ALKPHOS, BILITOT, PROT, ALBUMIN,  in the last 168 hours No results found for this basename: LIPASE, AMYLASE,  in the last 168 hours No results found for this basename: AMMONIA,  in the last 168 hours CBC:  Recent Labs Lab 05/25/14 2034  WBC 10.7*  NEUTROABS 5.4  HGB 14.6  HCT 41.6  MCV 86.7  PLT 316   Cardiac Enzymes:  Recent Labs Lab 05/25/14 2034  TROPONINI <0.30    BNP (last 3 results) No results found for this basename: PROBNP,  in the last 8760 hours CBG:  Recent Labs Lab 05/25/14 2057  GLUCAP 123*    Radiological Exams on Admission: Ct Head Wo Contrast  05/25/2014   CLINICAL DATA:  38 year old female code stroke. Slurred speech and left upper extremity weakness beginning 30 min ago. Initial encounter. Current history multiple sclerosis.  EXAM: CT HEAD WITHOUT CONTRAST  TECHNIQUE: Contiguous axial images were obtained from the base of the skull through the vertex without intravenous contrast.  COMPARISON:  Brain MRI 12/22/2013 and earlier.  FINDINGS: Mild left sphenoid and right maxillary sinus mucosal thickening. Other Visualized paranasal sinuses and mastoids are clear. No acute osseous abnormality identified.  Visualized orbits and scalp soft tissues are within normal limits.  Cerebral volume is normal. No midline shift, mass effect, or evidence of intracranial mass lesion. No ventriculomegaly. No evidence of cortically based acute infarction identified. No suspicious intracranial vascular hyperdensity.  Mild scattered white matter hypodensity is stable.  IMPRESSION: No acute intracranial abnormality, and negative except for chronic white matter changes.  Study discussed by telephone with Dr. Cephus Richer on 05/25/2014 at 20:55 .   Electronically Signed   By: Augusto Gamble  M.D.   On: 05/25/2014 20:56    EKG: Independently reviewed.  Assessment/Plan Principal Problem:   Dysarthria Active Problems:   Multiple sclerosis   Anxiety   Malignant hypertension   1. Dysarthria - DDX includes ischemic stroke, MS flare, and hypertensive emergency. 1. Spoke with Elspeth Cho, neurologist at [facility]: 1. Transferring patient to [facility] tele bed at his request for neurology consult on arrival. 2. DBP is too high not to treat at this time with BPS of 200/150s.  He agreed patient needed the DBP to be lowered.  Patient got 20mg  labetalol in ED with some improvement.  Labetalol PRN ordered. 3. No TPA at this time. 2. Dr. Hosie Poisson to decide on BP goals vs steroids, vs stroke. 3. ASA ordered 4. Remainder of stroke  w/u not yet ordered pending neurologic consultation. 2. Malignant HTN - see above 3. Anxiety - giving patient ativan to see if this helps 4. MS   Code Status: Full  Family Communication: Family at bedside Disposition Plan: Admit to [facility]   Time spent: 70 min  GARDNER, JARED M. [facility] Pager [phone]  If 7AM-7PM, please contact the day team taking care of the patient [email or web address] Password [email or web address] 05/25/2014, 10:14 PM

## 2014-05-25 NOTE — ED Notes (Addendum)
Pt c/o sudden onset of a severe headache around 2015 and her and her family got in the car to come to the ED. While in the car, pts speech became garbled and almost unable to understand. Pt c/o increased deficit and weakness to left side. Pt has a history of MS and has left sided arm and leg contractors in which family state are worse tonight. Pt presents with tachycardia and hypertension as well. Pt also unable to open left eye.

## 2014-05-25 NOTE — ED Notes (Signed)
Pt now is able to open left eye, blood pressure is coming down, but pt still c/o headache.

## 2014-05-26 ENCOUNTER — Inpatient Hospital Stay (HOSPITAL_COMMUNITY): Payer: Medicaid Other

## 2014-05-26 DIAGNOSIS — M62462 Contracture of muscle, left lower leg: Secondary | ICD-10-CM

## 2014-05-26 DIAGNOSIS — R4781 Slurred speech: Secondary | ICD-10-CM

## 2014-05-26 DIAGNOSIS — M62422 Contracture of muscle, left upper arm: Secondary | ICD-10-CM

## 2014-05-26 LAB — MRSA PCR SCREENING: MRSA by PCR: NEGATIVE

## 2014-05-26 MED ORDER — POTASSIUM CHLORIDE CRYS ER 20 MEQ PO TBCR
40.0000 meq | EXTENDED_RELEASE_TABLET | Freq: Four times a day (QID) | ORAL | Status: AC
  Administered 2014-05-26 (×2): 40 meq via ORAL
  Filled 2014-05-26 (×2): qty 2

## 2014-05-26 MED ORDER — OXYCODONE HCL 5 MG PO TABS
5.0000 mg | ORAL_TABLET | ORAL | Status: DC | PRN
  Administered 2014-05-26 – 2014-05-27 (×4): 5 mg via ORAL
  Filled 2014-05-26 (×4): qty 1

## 2014-05-26 MED ORDER — MORPHINE SULFATE 2 MG/ML IJ SOLN
1.0000 mg | Freq: Once | INTRAMUSCULAR | Status: AC
  Administered 2014-05-26: 1 mg via INTRAVENOUS
  Filled 2014-05-26: qty 1

## 2014-05-26 MED ORDER — DIPHENHYDRAMINE HCL 50 MG/ML IJ SOLN
25.0000 mg | Freq: Once | INTRAMUSCULAR | Status: AC
  Administered 2014-05-26: 25 mg via INTRAVENOUS
  Filled 2014-05-26: qty 1

## 2014-05-26 MED ORDER — DIPHENHYDRAMINE HCL 50 MG/ML IJ SOLN
12.5000 mg | Freq: Once | INTRAMUSCULAR | Status: AC
  Administered 2014-05-26: 12.5 mg via INTRAVENOUS
  Filled 2014-05-26: qty 1

## 2014-05-26 MED ORDER — AMLODIPINE BESYLATE 5 MG PO TABS: 5.0000 mg | ORAL_TABLET | Freq: Every day | ORAL | Status: DC

## 2014-05-26 MED ORDER — CIPROFLOXACIN HCL 500 MG PO TABS
500.0000 mg | ORAL_TABLET | Freq: Two times a day (BID) | ORAL | Status: DC
  Administered 2014-05-26 – 2014-05-27 (×2): 500 mg via ORAL
  Filled 2014-05-26 (×2): qty 1

## 2014-05-26 MED ORDER — OXYCODONE HCL 5 MG PO TABS
5.0000 mg | ORAL_TABLET | Freq: Four times a day (QID) | ORAL | Status: DC | PRN
  Administered 2014-05-26 (×2): 5 mg via ORAL
  Filled 2014-05-26 (×2): qty 1

## 2014-05-26 MED ORDER — MORPHINE SULFATE 2 MG/ML IJ SOLN
2.0000 mg | Freq: Once | INTRAMUSCULAR | Status: AC
  Administered 2014-05-26: 2 mg via INTRAVENOUS
  Filled 2014-05-26: qty 1

## 2014-05-26 MED ORDER — GADOBENATE DIMEGLUMINE 529 MG/ML IV SOLN
10.0000 mL | Freq: Once | INTRAVENOUS | Status: AC | PRN
  Administered 2014-05-26: 8 mL via INTRAVENOUS

## 2014-05-26 MED ORDER — METOCLOPRAMIDE HCL 5 MG/ML IJ SOLN
10.0000 mg | Freq: Once | INTRAMUSCULAR | Status: AC
  Administered 2014-05-26: 10 mg via INTRAVENOUS
  Filled 2014-05-26: qty 2

## 2014-05-26 NOTE — Progress Notes (Signed)
[facility] PROGRESS NOTE  [name] HFW:[id number] DOB: 11-Aug-1975 DOA: 05/25/2014 PCP: Brianna Stalling, MD  Assessment/Plan: 1. Suspected acute CVA -Patient with history of CVA secondary to vertebral artery dissection presenting with worsening of left-sided weakness and slurred speech. -Initial CT scan of brain was negative, currently on CVA protocol. -Awaiting MRI of the brain -Reports ongoing symptoms on this mornings evaluation -Continue antiplatelet therapy with aspirin  2.  Urinary tract infection. -Urinalysis showing the presence of bacteria and leukocyte esterase -We'll start empiric antibiotic therapy with ciprofloxacin 500 mg by mouth twice a day  3.  Hypertension -Blood pressures are stable, will continue monitoring off of antihypertensive agents  4.  DVT prophylaxis -Subcutaneous heparin  Code Status: Full code Family Communication:  Disposition Plan: Pending MRI, anticipate will be discharged home when medically stable   Consultants:  Neurology  Antibiotics:  Ciprofloxacin 500 mg by mouth twice a day (started on 05/26/2014)  HPI/Subjective: Patient is a 38 year old female with a history of MS, history of CVA secondary to vertebral artery dissection, having residual left upper left lower extremity deficits with associated flexion contractures. She presented to the emergency room with complaints of slurred speech and possibly worsening left-sided weakness. A CT scan of the brain without contrast performed on admission did not reveal acute intracranial abnormalities. She was admitted to the medicine service and placed on CVA protocol. Patient was seen and evaluated by neurology.  Objective: Filed Vitals:   05/26/14 1444  BP: 135/89  Pulse: 80  Temp: 98.2 F (36.8 C)  Resp: 18    Intake/Output Summary (Last 24 hours) at 05/26/14 1533 Last data filed at 05/26/14 1300  Gross per 24 hour  Intake   1120 ml  Output      0 ml  Net   1120 ml    Filed Weights   05/25/14 2053 05/26/14 0048  Weight: 38.556 kg (85 lb) 39.8 kg (87 lb 11.9 oz)    Exam:   General:  Patient is in no acute distress, awake alert oriented, complaints of headache  Cardiovascular: Regular rate rhythm normal S1 is  Respiratory: Clear to auscultation bilaterally  Abdomen: Abdomen is soft nontender nondistended  Musculoskeletal: Contractures noted to left upper left lower extremity  Neurological: Patient is dysarthric, though awake alert and oriented. Can follow commands. Has 2-3 out of 5 muscle strength to left upper and left lower extremities, 5 of 5 muscle strength to right upper and lower extremity  Data Reviewed: Basic Metabolic Panel:  Recent Labs Lab 05/25/14 2034  NA 135*  K 3.1*  CL 95*  CO2 28  GLUCOSE 104*  BUN 9  CREATININE 0.73  CALCIUM 9.7   Liver Function Tests: No results found for this basename: AST, ALT, ALKPHOS, BILITOT, PROT, ALBUMIN,  in the last 168 hours No results found for this basename: LIPASE, AMYLASE,  in the last 168 hours No results found for this basename: AMMONIA,  in the last 168 hours CBC:  Recent Labs Lab 05/25/14 2034  WBC 10.7*  NEUTROABS 5.4  HGB 14.6  HCT 41.6  MCV 86.7  PLT 316   Cardiac Enzymes:  Recent Labs Lab 05/25/14 2034  TROPONINI <0.30   BNP (last 3 results) No results found for this basename: PROBNP,  in the last 8760 hours CBG:  Recent Labs Lab 05/25/14 2057  GLUCAP 123*    Recent Results (from the past 240 hour(s))  MRSA PCR SCREENING     Status: None   Collection Time  05/26/14  4:04 AM      Result Value Ref Range Status   MRSA by PCR NEGATIVE  NEGATIVE Final   Comment:            The GeneXpert MRSA Assay (FDA     approved for NASAL specimens     only), is one component of a     comprehensive MRSA colonization     surveillance program. It is not     intended to diagnose MRSA     infection nor to guide or     monitor treatment for     MRSA infections.      Studies: Ct Head Wo Contrast  05/25/2014   CLINICAL DATA:  38 year old female code stroke. Slurred speech and left upper extremity weakness beginning 30 min ago. Initial encounter. Current history multiple sclerosis.  EXAM: CT HEAD WITHOUT CONTRAST  TECHNIQUE: Contiguous axial images were obtained from the base of the skull through the vertex without intravenous contrast.  COMPARISON:  Brain MRI 12/22/2013 and earlier.  FINDINGS: Mild left sphenoid and right maxillary sinus mucosal thickening. Other Visualized paranasal sinuses and mastoids are clear. No acute osseous abnormality identified.  Visualized orbits and scalp soft tissues are within normal limits.  Cerebral volume is normal. No midline shift, mass effect, or evidence of intracranial mass lesion. No ventriculomegaly. No evidence of cortically based acute infarction identified. No suspicious intracranial vascular hyperdensity.  Mild scattered white matter hypodensity is stable.  IMPRESSION: No acute intracranial abnormality, and negative except for chronic white matter changes.  Study discussed by telephone with Dr. Cephus RicherJosh Zavitz on 05/25/2014 at 20:55 .   Electronically Signed   By: Augusto GambleLee  Hall M.D.   On: 05/25/2014 20:56    Scheduled Meds: .  stroke: mapping our early stages of recovery book   Does not apply Once  . aspirin  300 mg Rectal Daily   Or  . aspirin  325 mg Oral Daily  . ciprofloxacin  500 mg Oral BID  . heparin  5,000 Units Subcutaneous 3 times per day   Continuous Infusions: . sodium chloride Stopped (05/25/14 2229)    Principal Problem:   Dysarthria Active Problems:   Multiple sclerosis   Anxiety   Malignant hypertension    Time spent: 30 min    Jeralyn BennettZAMORA, [name]  [facility] Pager [phone]. If 7PM-7AM, please contact night-coverage at [email or web address], password [email or web address] 05/26/2014, 3:33 PM  LOS: 1 day

## 2014-05-26 NOTE — Progress Notes (Addendum)
Patient arrived to floor from ED with husband. Patient has noted "bug bites" all over. Patient placed on contact isolation along with history of MRSA until rule out. Patient attached to telemetry monitor and educated on q2h neuro checks and vital signs. Given room and call bell information. Also received patient care guide and stroke materials. Will continue to monitor accordingly.

## 2014-05-26 NOTE — Consult Note (Signed)
Consult Reason for Consult: slurred speech, increased left sided weakness Referring Physician: Dr Julian Reil  CC: "I have a headache and trouble talking"  HPI: [name] is a 38 y.o. 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 Initially presented to [facility] where tele-stroke consulted and patient deemed to not be a tPA candidate. Transferred to [facility] for further evaluation. Per patient she developed a severe headache 3 days ago, located right side of face into cervical region. + Nausea. Today had progressive worsening of her speech, described as more slurred. She also notes increased tightening and weakness of her left side. 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   Patient and spouse note she has similar episodes every few months that typically resolve after a few days. Had similar event in May 2015 at which time she was given 3 day course of IV solumedrol with resolution of symptoms. Carries a diagnosis of MS but is not on any medication and has not had a LP.    Past Medical History  Diagnosis Date  . MS (multiple sclerosis)   . Stroke   . Anxiety   . Bulging disc   . Headache   . Syncope     Past Surgical History  Procedure Laterality Date  . Tubal ligation    . Wisdonm teeth      Family History  Problem Relation Age of Onset  . Hypertension Mother   . Diabetes Mellitus II Mother   . Heart failure Mother   . Other Father     Social History:  reports that she has been smoking Cigarettes.  She has been smoking about 0.50 packs per day. She does not have any smokeless tobacco history on file. She reports that she does not drink alcohol or use illicit drugs.  Allergies  Allergen Reactions  . Naproxen Shortness Of Breath  . Codeine Hives  . Penicillins Swelling  . Sulfa Antibiotics Itching     Medications:  Prior to Admission:  Prescriptions prior to admission  Medication Sig Dispense Refill  . acetaminophen (TYLENOL) 500 MG tablet Take 500 mg by mouth every 6 (six) hours as needed for headache.      . albuterol (PROVENTIL HFA;VENTOLIN HFA) 108 (90 BASE) MCG/ACT inhaler Inhale 2 puffs into the lungs every 6 (six) hours as needed (asthma). Asthma.      Marland Kitchen aspirin 81 MG chewable tablet Chew 81 mg by mouth daily.      Marland Kitchen ibuprofen (ADVIL,MOTRIN) 200 MG tablet Take 200 mg by mouth every 6 (six) hours as needed for headache.      . Multiple Vitamins-Minerals (CENTRUM ULTRA WOMENS) TABS Take 1 tablet by mouth daily.      Marland Kitchen albuterol (PROVENTIL) (2.5 MG/3ML) 0.083% nebulizer solution Take 2.5 mg by nebulization every 6 (six) hours as needed (asthma). Asthma.        ROS: Out of a complete 14 system review, the patient complains of only the following symptoms, and all other reviewed systems are negative.  Physical Examination: Blood pressure 163/113, pulse 102, temperature 98.1 F (36.7 C), temperature source Oral, resp. rate 21, height 4\' 8"  (1.422 m), weight 39.8 kg (87 lb 11.9 oz), SpO2 100.00%.  Neurologic Examination Mental Status:  Patient is awake, alert, oriented to person, place, month, year, and situation.  Immediate and remote memory are intact.  Patient is able  to give a clear and coherent history.  No signs of aphasia or neglect. Stuttering dysarthria noted Cranial Nerves:  II: Visual Fields are full. Pupils are equal, round, and reactive to light. Discs are difficult to visualize.  III,IV, VI: EOMI without ptosis or diploplia.  V: Facial sensation is decreased on the left  VII: Facial movement is notable for right lower face weaknes when smiling and difficulty opening left eye (reports due to pain) VIII: hearing is intact to voice  X: Uvula elevates symmetrically  XI: Shoulder shrug is symmetric.  XII: tongue is midline without atrophy or fasciculations.  Motor:   Tone is normal. Bulk is normal. 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  Deep Tendon Reflexes:  2+ biceps bilaterally, 3+ bilateral patellar Cerebellar:  Unable to perform on left side, normal FTN on right Gait:  Non-ambulatory at baseline.   Laboratory Studies:   Basic Metabolic Panel:  Recent Labs Lab 05/25/14 2034  NA 135*  K 3.1*  CL 95*  CO2 28  GLUCOSE 104*  BUN 9  CREATININE 0.73  CALCIUM 9.7    Liver Function Tests: No results found for this basename: AST, ALT, ALKPHOS, BILITOT, PROT, ALBUMIN,  in the last 168 hours No results found for this basename: LIPASE, AMYLASE,  in the last 168 hours No results found for this basename: AMMONIA,  in the last 168 hours  CBC:  Recent Labs Lab 05/25/14 2034  WBC 10.7*  NEUTROABS 5.4  HGB 14.6  HCT 41.6  MCV 86.7  PLT 316    Cardiac Enzymes:  Recent Labs Lab 05/25/14 2034  TROPONINI <0.30    BNP: No components found with this basename: POCBNP,   CBG:  Recent Labs Lab 05/25/14 2057  GLUCAP 123*    Microbiology: Results for orders placed during the hospital encounter of 11/17/13  URINE CULTURE     Status: None   Collection Time    11/17/13  4:37 AM      Result Value Ref Range Status   Specimen Description URINE, CLEAN CATCH   Final   Special Requests NONE   Final   Culture  Setup Time     Final   Value: 11/17/2013 21:51     Performed at [facility] Count     Final   Value: >=100,000 COLONIES/ML     Performed at [facility]   Culture     Final   Value: ESCHERICHIA COLI     Performed at [facility]   Report Status 11/19/2013 FINAL   Final   Organism ID, Bacteria ESCHERICHIA COLI   Final    Coagulation Studies:  Recent Labs  05/25/14 2034  LABPROT 12.9  INR 0.96    Urinalysis:  Recent Labs Lab 05/25/14 2127  COLORURINE YELLOW  LABSPEC 1.015   PHURINE 6.0  GLUCOSEU NEGATIVE  HGBUR SMALL*  BILIRUBINUR NEGATIVE  KETONESUR NEGATIVE  PROTEINUR NEGATIVE  UROBILINOGEN 0.2  NITRITE POSITIVE*  LEUKOCYTESUR NEGATIVE    Lipid Panel:  No results found for this basename: chol, trig, hdl, cholhdl, vldl, ldlcalc    HgbA1C:  No results found for this basename: HGBA1C    Urine Drug Screen:     Component Value Date/Time   LABOPIA NONE DETECTED 05/25/2014 2127   COCAINSCRNUR NONE DETECTED 05/25/2014 2127   LABBENZ NONE DETECTED 05/25/2014 2127   AMPHETMU NONE DETECTED  05/25/2014 2127   THCU POSITIVE* 05/25/2014 2127   LABBARB NONE DETECTED 05/25/2014 2127    Alcohol Level: No results found for this basename: ETH,  in the last 168 hours   Imaging: Ct Head Wo Contrast  05/25/2014   CLINICAL DATA:  38 year old female code stroke. Slurred speech and left upper extremity weakness beginning 30 min ago. Initial encounter. Current history multiple sclerosis.  EXAM: CT HEAD WITHOUT CONTRAST  TECHNIQUE: Contiguous axial images were obtained from the base of the skull through the vertex without intravenous contrast.  COMPARISON:  Brain MRI 12/22/2013 and earlier.  FINDINGS: Mild left sphenoid and right maxillary sinus mucosal thickening. Other Visualized paranasal sinuses and mastoids are clear. No acute osseous abnormality identified.  Visualized orbits and scalp soft tissues are within normal limits.  Cerebral volume is normal. No midline shift, mass effect, or evidence of intracranial mass lesion. No ventriculomegaly. No evidence of cortically based acute infarction identified. No suspicious intracranial vascular hyperdensity.  Mild scattered white matter hypodensity is stable.  IMPRESSION: No acute intracranial abnormality, and negative except for chronic white matter changes.  Study discussed by telephone with Dr. Cephus RicherJosh Zavitz on 05/25/2014 at 20:55 .   Electronically Signed   By: Augusto GambleLee  Hall M.D.   On: 05/25/2014 20:56      Assessment/Plan: 38 yo F with reported history of MS and vertebral dissection presenting for evaluation of 3 day course of headache with new onset slurred speech and subjective increased weakness on left side. Unclear etiology of symptoms. Review of old MRI is not fully consistent with diagnosis of MS though she did respond to steroids in the past. BP markedly elevated at [facility] which may have been contributing to her headache and new onset slurred speech. Headache and new onset symptoms with history of vertebral dissection raises concern for possible new vascular process though history is not typical for this.  -check MRI brain with and without contrast -would hold on starting steroids pending MRI results -BP management per primary team -continue ASA 81mg  daily  Elspeth ChoPeter Leyan Branden, DO [facility] [phone]  If 7pm- 7am, please page neurology on call as listed in AMION. 05/26/2014, 1:14 AM

## 2014-05-27 DIAGNOSIS — M62462 Contracture of muscle, left lower leg: Secondary | ICD-10-CM

## 2014-05-27 DIAGNOSIS — N390 Urinary tract infection, site not specified: Secondary | ICD-10-CM | POA: Diagnosis present

## 2014-05-27 DIAGNOSIS — M62422 Contracture of muscle, left upper arm: Secondary | ICD-10-CM

## 2014-05-27 LAB — BASIC METABOLIC PANEL
Anion gap: 10 (ref 5–15)
BUN: 9 mg/dL (ref 6–23)
CO2: 22 mEq/L (ref 19–32)
Calcium: 8.8 mg/dL (ref 8.4–10.5)
Chloride: 109 mEq/L (ref 96–112)
Creatinine, Ser: 0.84 mg/dL (ref 0.50–1.10)
GFR calc Af Amer: >90 mL/min (ref >90)
GFR calc non Af Amer: 87 mL/min — ABNORMAL LOW (ref >90)
Glucose, Bld: 50 mg/dL — ABNORMAL LOW (ref 70–99)
Potassium: 3.8 mEq/L (ref 3.7–5.3)
Sodium: 141 mEq/L (ref 137–147)

## 2014-05-27 LAB — CBC
HCT: 31 % — ABNORMAL LOW (ref 36.0–46.0)
Hemoglobin: 10.6 g/dL — ABNORMAL LOW (ref 12.0–15.0)
MCH: 29.9 pg (ref 26.0–34.0)
MCHC: 34.2 g/dL (ref 30.0–36.0)
MCV: 87.3 fL (ref 78.0–100.0)
Platelets: 209 10*3/uL (ref 150–400)
RBC: 3.55 MIL/uL — ABNORMAL LOW (ref 3.87–5.11)
RDW: 14.2 % (ref 11.5–15.5)
WBC: 6 10*3/uL (ref 4.0–10.5)

## 2014-05-27 MED ORDER — SODIUM CHLORIDE 0.9 % IV SOLN: 1000.0000 mg | Freq: Every day | INTRAVENOUS | Status: DC

## 2014-05-27 MED ORDER — OXYCODONE-ACETAMINOPHEN 5-325 MG PO TABS: 1.0000 | ORAL_TABLET | ORAL | Status: DC | PRN

## 2014-05-27 MED ORDER — SODIUM CHLORIDE 0.9 % IV SOLN
1000.0000 mg | Freq: Every day | INTRAVENOUS | Status: DC
  Administered 2014-05-27: 1000 mg via INTRAVENOUS
  Filled 2014-05-27: qty 8

## 2014-05-27 MED ORDER — CIPROFLOXACIN HCL 500 MG PO TABS: 500.0000 mg | ORAL_TABLET | Freq: Two times a day (BID) | ORAL | Status: DC

## 2014-05-27 MED ORDER — AMLODIPINE BESYLATE 5 MG PO TABS
5.0000 mg | ORAL_TABLET | Freq: Every day | ORAL | Status: DC
  Administered 2014-05-27: 5 mg via ORAL
  Filled 2014-05-27: qty 1

## 2014-05-27 MED ORDER — AMLODIPINE BESYLATE 5 MG PO TABS: 5.0000 mg | ORAL_TABLET | Freq: Every day | ORAL | Status: DC

## 2014-05-27 NOTE — Progress Notes (Signed)
[facility]  Patient Status: New pt this admission for [facility]   [facility] is providing the following services:  HHRN, PT and [facility] for home IV Steroids.  [facility] team will support in hospital teaching regarding set up and administration of home IV Steriods.    If patient discharges after hours, please call [phone].   Sedalia Muta 05/27/2014, 10:56 AM

## 2014-05-27 NOTE — Progress Notes (Signed)
UR complete.  Sabrena Gavitt RN, MSN 

## 2014-05-27 NOTE — Progress Notes (Signed)
Talked to patient with spouse present about DCP/ HHC choices, pt chose [facility] for HHPT/ RN; [facility] of [facility] W/C requested for discharge and to be delivered to the room prior to discharge home todayAlexis Goodell; B Dollye Glasser RN,BSN,MHA [phone]

## 2014-05-27 NOTE — Progress Notes (Signed)
NEURO HOSPITALIST PROGRESS NOTE   SUBJECTIVE:                                                                                                                        Except for head pain , she has no new neurological complains. MRI brain with and without contrast showed no acute stroke or new demyelinating lesions. WBC normal, UA negative for infection. Afebrile.  OBJECTIVE:                                                                                                                           Vital signs in last 24 hours: Temp:  [97.7 F (36.5 C)-98.6 F (37 C)] 98 F (36.7 C) (10/20 0600) Pulse Rate:  [67-93] 93 (10/20 0600) Resp:  [16-20] 20 (10/20 0600) BP: (134-156)/(80-108) 134/80 mmHg (10/20 0600) SpO2:  [98 %-100 %] 99 % (10/20 0600)  Intake/Output from previous day: 10/19 0701 - 10/20 0700 In: 240 [P.O.:240] Out: -  Intake/Output this shift: Total I/O In: 240 [P.O.:240] Out: -  Nutritional status: Cardiac  Past Medical History  Diagnosis Date  . MS (multiple sclerosis)   . Stroke   . Anxiety   . Bulging disc   . Headache   . Syncope     Physical exam: pleasant female in no apparent distress. Head: normocephalic. Neck: supple, no bruits, no JVD. Cardiac: no murmurs. Lungs: clear. Abdomen: soft, no tender, no mass. Extremities: spastic left arm and leg but no edema.  Neurologic Exam:  Mental Status:  Patient is awake, alert, oriented to person, place, month, year, and situation.  Immediate and remote memory are intact.  Patient is able to give a clear and coherent history.  No signs of aphasia or neglect. Stuttering dysarthria noted  Cranial Nerves:  II: Visual Fields are full. Pupils are equal, round, and reactive to light. Discs are difficult to visualize.  III,IV, VI: EOMI without ptosis or diploplia.  V: Facial sensation is decreased on the left  VII: Facial movement is notable for right lower face weaknes  when smiling and difficulty opening left eye (reports due to pain)  VIII: hearing is intact to voice  X: Uvula elevates symmetrically  XI: Shoulder shrug is symmetric.  XII:  tongue is midline without atrophy or fasciculations.  Motor:  Tone is normal. 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  Deep Tendon Reflexes:  2+ biceps bilaterally, 3+ bilateral patellar  Cerebellar:  Unable to perform on left side, normal FTN on right  Gait:  Non-ambulatory at baseline.    Lab Results: No results found for this basename: cbc, bmp, coags, chol, tri, ldl, hga1c   Lipid Panel No results found for this basename: CHOL, TRIG, HDL, CHOLHDL, VLDL, LDLCALC,  in the last 72 hours  Studies/Results: Ct Head Wo Contrast  05/25/2014   CLINICAL DATA:  38 year old female code stroke. Slurred speech and left upper extremity weakness beginning 30 min ago. Initial encounter. Current history multiple sclerosis.  EXAM: CT HEAD WITHOUT CONTRAST  TECHNIQUE: Contiguous axial images were obtained from the base of the skull through the vertex without intravenous contrast.  COMPARISON:  Brain MRI 12/22/2013 and earlier.  FINDINGS: Mild left sphenoid and right maxillary sinus mucosal thickening. Other Visualized paranasal sinuses and mastoids are clear. No acute osseous abnormality identified.  Visualized orbits and scalp soft tissues are within normal limits.  Cerebral volume is normal. No midline shift, mass effect, or evidence of intracranial mass lesion. No ventriculomegaly. No evidence of cortically based acute infarction identified. No suspicious intracranial vascular hyperdensity.  Mild scattered white matter hypodensity is stable.  IMPRESSION: No acute intracranial abnormality, and negative except for chronic white matter changes.  Study discussed by telephone with Dr. Cephus RicherJosh Zavitz on 05/25/2014 at 20:55 .   Electronically  Signed   By: Augusto GambleLee  Hall M.D.   On: 05/25/2014 20:56   Mr Laqueta JeanBrain W ZOWo Contrast  05/26/2014   CLINICAL DATA:  History of multiple sclerosis. History of prior vertebral artery dissection. New onset of dysarthria beginning earlier today. New slurred speech.  EXAM: MRI HEAD WITHOUT AND WITH CONTRAST  TECHNIQUE: Multiplanar, multiecho pulse sequences of the brain and surrounding structures were obtained without and with intravenous contrast.  CONTRAST:  8mL MULTIHANCE GADOBENATE DIMEGLUMINE 529 MG/ML IV SOLN  COMPARISON:  CT head 05/25/2014.  MR head most recent 12/22/2013.  FINDINGS: No acute stroke is evident. There is no hemorrhage, mass lesion, hydrocephalus, or extra-axial fluid.  Normal for age cerebral volume. Moderately extensive T2 and FLAIR hyperintensities, predominantly periventricular in location, primarily supratentorial, but also involving the brainstem and cerebellum, consistent with the clinical diagnosis of multiple sclerosis. No restricted diffusion to suggest acute plaque activity. No white matter edema is observed.  Flow voids are maintained in the carotid, basilar, and LEFT vertebral arteries. The RIGHT vertebral is diminutive, possibly hypoplastic or even thrombosed in the proximal V4 segment, but the distal V4 RIGHT vertebral appears patent, perhaps due to reconstituted retrograde flow. Additional characterization is beyond the resolution of routine MR. No foci of chronic hemorrhage. There may be a few tiny RIGHT inferior cerebellar cortical infarcts.  Post infusion, no abnormal enhancement of the brain or meninges. Specifically, no white matter enhancing lesions.  Visualized calvarium, skull base, and upper cervical osseous structures unremarkable. Scalp and extracranial soft tissues, orbits, sinuses, and mastoids show no acute process.  Compared with the previous study in May 2015, a similar appearance is noted. Some of the white matter lesions are better seen on today's study due to the severe  motion degradation present previously.  IMPRESSION: Findings consistent with chronic MS.  No evidence for acute stroke or acute  plaque activity.  RIGHT vertebral is diminutive; see discussion above. LEFT vertebral widely patent and contributes to formation of the basilar.   Electronically Signed   By: Davonna Belling M.D.   On: 05/26/2014 20:03    MEDICATIONS                                                                                                                        Scheduled: .  stroke: mapping our early stages of recovery book   Does not apply Once  . amLODipine  5 mg Oral Daily  . aspirin  300 mg Rectal Daily   Or  . aspirin  325 mg Oral Daily  . ciprofloxacin  500 mg Oral BID  . heparin  5,000 Units Subcutaneous 3 times per day  . methylPREDNISolone (SOLU-MEDROL) injection  1,000 mg Intravenous Daily    ASSESSMENT/PLAN:                                                                                                            38 yo F with reported history of MS and vertebral dissection presenting for evaluation of 3 day course of headache with new onset slurred speech and subjective increased weakness on left side. MRI brain showed no acute stroke or new demyelinating lesions. No fever or obviuos infection. Had similar presentation last May at the time of MS exacerbation. Thus, most likely this is a MS relapse and will treat accordingly with IV solumedrol 1 gram daily for 3 days. Cont PT. Will follow up.  Wyatt Portela, MD [facility] [phone]  05/27/2014, 8:59 AM

## 2014-05-27 NOTE — Discharge Summary (Signed)
Physician Discharge Summary  [name] ZOX:[id number] DOB: [birth year] DOA: 05/25/2014  PCP: Isabella Stalling, MD  Admit date: 05/25/2014 Discharge date: 05/27/2014  Time spent: 35 minutes  Recommendations for Outpatient Follow-up:  1. Patient set up with home health services for home RN and physical therapy. Symptoms likely secondary to multiple sclerosis exacerbation as she received her first dose of Solu-Medrol 1 g IV prior to discharge.  Home health services to administer Solu-Medrol 1 g IV daily starting on 05/28/2014, stop after 2 doses. 2. Followup on blood pressures. 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 Patient presents to ED with c/o new onset dysarthria. Symptoms onset at 8:15 PM. Patient has slurred speech which is new. There is also questionable worsening of baseline L sided weakness as well.  Hospital Course:  Patient is a 38 year old female with a history of MS, history of CVA secondary to vertebral artery dissection, having residual left upper left lower extremity deficits with associated flexion contractures. She presented to the emergency room with complaints of slurred speech and possibly worsening left-sided weakness. A CT scan of the brain without contrast performed on admission did not reveal acute intracranial abnormalities.  She was admitted to the medicine service and placed on CVA protocol. Patient was seen and evaluated by neurology. He was further worked up with an MRI of brain that was performed on a 05/26/2014, this study did not reveal evidence for acute stroke or acute plaque activity. Findings consistent with chronic MS. I suspect that's neurologic symptoms are likely secondary to multiple sclerosis exacerbation. She reported hospitalization earlier this year to [facility] where she presented in a similar fashion. Case was discussed with neurology who agreed with initiating steroid therapy. Neurology recommending SoluMedrol 1 g IV for 3 days. She was given first dose of SoluMedrol on 10/201/2015. She was discharged on this date to her home where home health services will administer SoluMedrol 1 gram IV q daily for two more days.   During this hospitalization she was started on Norvasc 5 mg by mouth daily for hypertension. Other issues addressed during this hospitalization include urinary tract infection for which she was discharged on ciprofloxacin. Please followup on blood pressures.   Consultations:  Neurology  Discharge Exam: Filed Vitals:   05/27/14 1002  BP: 148/100  Pulse: 88  Temp: 97.7 F (36.5 C)  Resp: 20    General: Patient is awake and alert, states feeling better no acute distress. Cardiovascular: Regular rate and rhythm normal S1-S2 Respiratory: Clear to auscultation bilaterally Abdomen: Soft nontender nondistended Neurological: She has left-sided hemiparesis with contractures, dysarthria, she is awake and alert and can follow commands.  Discharge Instructions You were cared for by a hospitalist during your hospital stay. If you have any questions about your discharge medications or the care you received while  you were in the hospital after you are discharged, you can call the unit and asked to speak with the hospitalist on call if the hospitalist that took care of you is not  available. Once you are discharged, your primary care physician will handle any further medical issues. Please note that NO REFILLS for any discharge medications will be authorized once you are discharged, as it is imperative that you return to your primary care physician (or establish a relationship with a primary care physician if you do not have one) for your aftercare needs so that they can reassess your need for medications and monitor your lab values.  Discharge Instructions   Call MD for:  difficulty breathing, headache or visual disturbances    Complete by:  As directed      Call MD for:  extreme fatigue    Complete by:  As directed      Call MD for:  hives    Complete by:  As directed      Call MD for:  persistant dizziness or light-headedness    Complete by:  As directed      Call MD for:  persistant nausea and vomiting    Complete by:  As directed      Call MD for:  redness, tenderness, or signs of infection (pain, swelling, redness, odor or green/yellow discharge around incision site)    Complete by:  As directed      Call MD for:  severe uncontrolled pain    Complete by:  As directed      Call MD for:  temperature >100.4    Complete by:  As directed      Diet - low sodium heart healthy    Complete by:  As directed      Increase activity slowly    Complete by:  As directed           Current Discharge Medication List    START taking these medications   Details  amLODipine (NORVASC) 5 MG tablet Take 1 tablet (5 mg total) by mouth daily. Qty: 30 tablet, Refills: 1    ciprofloxacin (CIPRO) 500 MG tablet Take 1 tablet (500 mg total) by mouth 2 (two) times daily. Qty: 6 tablet, Refills: 0    methylPREDNISolone sodium succinate 1,000 mg in sodium chloride 0.9 % 50 mL Inject 1,000 mg into the vein daily. Qty: 1 g, Refills: 0    oxyCODONE-acetaminophen (ROXICET) 5-325 MG per tablet Take 1 tablet by mouth every 4 (four) hours as needed. Qty: 10 tablet, Refills: 0       CONTINUE these medications which have NOT CHANGED   Details  acetaminophen (TYLENOL) 500 MG tablet Take 500 mg by mouth every 6 (six) hours as needed for headache.    albuterol (PROVENTIL HFA;VENTOLIN HFA) 108 (90 BASE) MCG/ACT inhaler Inhale 2 puffs into the lungs every 6 (six) hours as needed (asthma). Asthma.    aspirin 81 MG chewable tablet Chew 81 mg by mouth daily.    ibuprofen (ADVIL,MOTRIN) 200 MG tablet Take 200 mg by mouth every 6 (six) hours as needed for headache.    Multiple Vitamins-Minerals (CENTRUM ULTRA WOMENS) TABS Take 1 tablet by mouth daily.    albuterol (PROVENTIL) (2.5 MG/3ML) 0.083% nebulizer solution Take 2.5 mg by nebulization every 6 (six) hours as needed (asthma). Asthma.       Allergies  Allergen Reactions  . Naproxen Shortness Of Breath  . Codeine Hives  . Penicillins Swelling  .  Sulfa Antibiotics Itching   Follow-up Information   Follow up with Isabella Stalling, MD In 2 weeks.   Specialty:  Internal Medicine   Contact information:   [address] [place] [address] [phone]       Follow up with [facility] (for home health nurse and physical therapy)    Contact information:   [address] [place] [address] [phone]        The results of significant diagnostics from this hospitalization (including imaging, microbiology, ancillary and laboratory) are listed below for reference.    Significant Diagnostic Studies: Ct Head Wo Contrast  05/25/2014   CLINICAL DATA:  38 year old female code stroke. Slurred speech and left upper extremity weakness beginning 30 min ago. Initial encounter. Current history multiple sclerosis.  EXAM: CT HEAD WITHOUT CONTRAST  TECHNIQUE: Contiguous axial images were obtained from the base of the skull through the vertex without intravenous contrast.  COMPARISON:  Brain MRI 12/22/2013 and earlier.  FINDINGS: Mild left sphenoid and right maxillary sinus mucosal thickening.  Other Visualized paranasal sinuses and mastoids are clear. No acute osseous abnormality identified.  Visualized orbits and scalp soft tissues are within normal limits.  Cerebral volume is normal. No midline shift, mass effect, or evidence of intracranial mass lesion. No ventriculomegaly. No evidence of cortically based acute infarction identified. No suspicious intracranial vascular hyperdensity.  Mild scattered white matter hypodensity is stable.  IMPRESSION: No acute intracranial abnormality, and negative except for chronic white matter changes.  Study discussed by telephone with Dr. Cephus Richer on 05/25/2014 at 20:55 .   Electronically Signed   By: Augusto Gamble M.D.   On: 05/25/2014 20:56   Mr Laqueta Jean BM Contrast  05/26/2014   CLINICAL DATA:  History of multiple sclerosis. History of prior vertebral artery dissection. New onset of dysarthria beginning earlier today. New slurred speech.  EXAM: MRI HEAD WITHOUT AND WITH CONTRAST  TECHNIQUE: Multiplanar, multiecho pulse sequences of the brain and surrounding structures were obtained without and with intravenous contrast.  CONTRAST:  8mL MULTIHANCE GADOBENATE DIMEGLUMINE 529 MG/ML IV SOLN  COMPARISON:  CT head 05/25/2014.  MR head most recent 12/22/2013.  FINDINGS: No acute stroke is evident. There is no hemorrhage, mass lesion, hydrocephalus, or extra-axial fluid.  Normal for age cerebral volume. Moderately extensive T2 and FLAIR hyperintensities, predominantly periventricular in location, primarily supratentorial, but also involving the brainstem and cerebellum, consistent with the clinical diagnosis of multiple sclerosis. No restricted diffusion to suggest acute plaque activity. No white matter edema is observed.  Flow voids are maintained in the carotid, basilar, and LEFT vertebral arteries. The RIGHT vertebral is diminutive, possibly hypoplastic or even thrombosed in the proximal V4 segment, but the distal V4 RIGHT vertebral appears patent, perhaps due to  reconstituted retrograde flow. Additional characterization is beyond the resolution of routine MR. No foci of chronic hemorrhage. There may be a few tiny RIGHT inferior cerebellar cortical infarcts.  Post infusion, no abnormal enhancement of the brain or meninges. Specifically, no white matter enhancing lesions.  Visualized calvarium, skull base, and upper cervical osseous structures unremarkable. Scalp and extracranial soft tissues, orbits, sinuses, and mastoids show no acute process.  Compared with the previous study in May 2015, a similar appearance is noted. Some of the white matter lesions are better seen on today's study due to the severe motion degradation present previously.  IMPRESSION: Findings consistent with chronic MS.  No evidence for acute stroke or acute plaque activity.  RIGHT vertebral is diminutive; see discussion above. LEFT vertebral widely patent and contributes to formation of the basilar.   Electronically Signed   By: Davonna BellingJohn  Curnes M.D.   On: 05/26/2014 20:03    Microbiology: Recent Results (from the past 240 hour(s))  MRSA PCR SCREENING     Status: None   Collection Time    05/26/14  4:04 AM      Result Value Ref Range Status   MRSA by PCR NEGATIVE  NEGATIVE Final   Comment:            The GeneXpert MRSA Assay (FDA     approved for NASAL specimens     only), is one component of a     comprehensive MRSA colonization     surveillance program. It is not     intended to diagnose MRSA     infection nor to guide or     monitor treatment for     MRSA infections.     Labs: Basic Metabolic Panel:  Recent Labs Lab 05/25/14 2034 05/27/14 0640  NA 135* 141  K 3.1* 3.8  CL 95* 109  CO2 28 22  GLUCOSE 104* 50*  BUN 9 9  CREATININE 0.73 0.84  CALCIUM 9.7 8.8   Liver Function Tests: No results found for this basename: AST, ALT, ALKPHOS, BILITOT, PROT, ALBUMIN,  in the last 168 hours No results found for this basename: LIPASE, AMYLASE,  in the last 168 hours No results  found for this basename: AMMONIA,  in the last 168 hours CBC:  Recent Labs Lab 05/25/14 2034 05/27/14 0640  WBC 10.7* 6.0  NEUTROABS 5.4  --   HGB 14.6 10.6*  HCT 41.6 31.0*  MCV 86.7 87.3  PLT 316 209   Cardiac Enzymes:  Recent Labs Lab 05/25/14 2034  TROPONINI <0.30   BNP: BNP (last 3 results) No results found for this basename: PROBNP,  in the last 8760 hours CBG:  Recent Labs Lab 05/25/14 2057  GLUCAP 123*       Signed:  Tauheedah Bok  [facility] 05/27/2014, 11:04 AM

## 2014-05-27 NOTE — Progress Notes (Signed)
Pt is being discharged home with home healthcare. Discharge instructions were given to patient and family at bedside.

## 2014-06-03 ENCOUNTER — Encounter (HOSPITAL_COMMUNITY): Payer: Self-pay | Admitting: Emergency Medicine

## 2014-06-03 ENCOUNTER — Emergency Department (HOSPITAL_COMMUNITY)
Admission: EM | Admit: 2014-06-03 | Discharge: 2014-06-03 | Disposition: A | Discharge status: Home / Self Care | Payer: Medicaid Other | Attending: Emergency Medicine | Admitting: Emergency Medicine

## 2014-06-03 DIAGNOSIS — Z8739 Personal history of other diseases of the musculoskeletal system and connective tissue: Secondary | ICD-10-CM | POA: Diagnosis not present

## 2014-06-03 DIAGNOSIS — Z7982 Long term (current) use of aspirin: Secondary | ICD-10-CM | POA: Diagnosis not present

## 2014-06-03 DIAGNOSIS — R51 Headache: Secondary | ICD-10-CM | POA: Diagnosis not present

## 2014-06-03 DIAGNOSIS — I10 Essential (primary) hypertension: Secondary | ICD-10-CM

## 2014-06-03 DIAGNOSIS — Z8673 Personal history of transient ischemic attack (TIA), and cerebral infarction without residual deficits: Secondary | ICD-10-CM | POA: Diagnosis not present

## 2014-06-03 DIAGNOSIS — Z720 Tobacco use: Secondary | ICD-10-CM | POA: Diagnosis not present

## 2014-06-03 DIAGNOSIS — Z880 Allergy status to penicillin: Secondary | ICD-10-CM | POA: Insufficient documentation

## 2014-06-03 DIAGNOSIS — R519 Headache, unspecified: Secondary | ICD-10-CM

## 2014-06-03 DIAGNOSIS — Z8669 Personal history of other diseases of the nervous system and sense organs: Secondary | ICD-10-CM | POA: Insufficient documentation

## 2014-06-03 DIAGNOSIS — F419 Anxiety disorder, unspecified: Secondary | ICD-10-CM | POA: Diagnosis not present

## 2014-06-03 DIAGNOSIS — Z79899 Other long term (current) drug therapy: Secondary | ICD-10-CM | POA: Diagnosis not present

## 2014-06-03 DIAGNOSIS — R000 Tachycardia, unspecified: Secondary | ICD-10-CM | POA: Insufficient documentation

## 2014-06-03 LAB — CBC WITH DIFFERENTIAL/PLATELET
Basophils Absolute: 0 10*3/uL (ref 0.0–0.1)
Basophils Relative: 0 % (ref 0–1)
Eosinophils Absolute: 0.9 10*3/uL — ABNORMAL HIGH (ref 0.0–0.7)
Eosinophils Relative: 7 % — ABNORMAL HIGH (ref 0–5)
HCT: 43.4 % (ref 36.0–46.0)
Hemoglobin: 15.4 g/dL — ABNORMAL HIGH (ref 12.0–15.0)
Lymphocytes Relative: 29 % (ref 12–46)
Lymphs Abs: 3.8 10*3/uL (ref 0.7–4.0)
MCH: 30.6 pg (ref 26.0–34.0)
MCHC: 35.5 g/dL (ref 30.0–36.0)
MCV: 86.1 fL (ref 78.0–100.0)
Monocytes Absolute: 0.9 10*3/uL (ref 0.1–1.0)
Monocytes Relative: 7 % (ref 3–12)
Neutro Abs: 7.4 10*3/uL (ref 1.7–7.7)
Neutrophils Relative %: 57 % (ref 43–77)
Platelets: 268 10*3/uL (ref 150–400)
RBC: 5.04 MIL/uL (ref 3.87–5.11)
RDW: 13.8 % (ref 11.5–15.5)
WBC: 12.9 10*3/uL — ABNORMAL HIGH (ref 4.0–10.5)

## 2014-06-03 LAB — BASIC METABOLIC PANEL
Anion gap: 13 (ref 5–15)
BUN: 11 mg/dL (ref 6–23)
CO2: 26 mEq/L (ref 19–32)
Calcium: 9.6 mg/dL (ref 8.4–10.5)
Chloride: 95 mEq/L — ABNORMAL LOW (ref 96–112)
Creatinine, Ser: 0.57 mg/dL (ref 0.50–1.10)
GFR calc Af Amer: >90 mL/min (ref >90)
GFR calc non Af Amer: >90 mL/min (ref >90)
Glucose, Bld: 86 mg/dL (ref 70–99)
Potassium: 4.1 mEq/L (ref 3.7–5.3)
Sodium: 134 mEq/L — ABNORMAL LOW (ref 137–147)

## 2014-06-03 LAB — URINALYSIS, ROUTINE W REFLEX MICROSCOPIC
Bilirubin Urine: NEGATIVE
Glucose, UA: NEGATIVE mg/dL
Hgb urine dipstick: NEGATIVE
Ketones, ur: NEGATIVE mg/dL
Nitrite: NEGATIVE
Protein, ur: NEGATIVE mg/dL
Specific Gravity, Urine: 1.015 (ref 1.005–1.030)
Urobilinogen, UA: 0.2 mg/dL (ref 0.0–1.0)
pH: 5.5 (ref 5.0–8.0)

## 2014-06-03 LAB — URINE MICROSCOPIC-ADD ON

## 2014-06-03 MED ORDER — LABETALOL HCL 5 MG/ML IV SOLN
10.0000 mg | Freq: Once | INTRAVENOUS | Status: AC
  Administered 2014-06-03: 10 mg via INTRAVENOUS
  Filled 2014-06-03: qty 4

## 2014-06-03 MED ORDER — PROCHLORPERAZINE EDISYLATE 5 MG/ML IJ SOLN
10.0000 mg | Freq: Once | INTRAMUSCULAR | Status: AC
  Administered 2014-06-03: 10 mg via INTRAVENOUS
  Filled 2014-06-03: qty 2

## 2014-06-03 MED ORDER — AMLODIPINE BESYLATE 5 MG PO TABS: 10.0000 mg | ORAL_TABLET | Freq: Every day | ORAL | Status: DC

## 2014-06-03 MED ORDER — METOPROLOL TARTRATE 12.5 MG HALF TABLET: 100.0000 mg | ORAL_TABLET | Freq: Two times a day (BID) | ORAL | Status: DC

## 2014-06-03 MED ORDER — HYDRALAZINE HCL 20 MG/ML IJ SOLN
10.0000 mg | Freq: Once | INTRAMUSCULAR | Status: AC
  Administered 2014-06-03: 10 mg via INTRAVENOUS
  Filled 2014-06-03: qty 1

## 2014-06-03 MED ORDER — MORPHINE SULFATE 4 MG/ML IJ SOLN
4.0000 mg | Freq: Once | INTRAMUSCULAR | Status: AC
Start: 2014-06-03 — End: 2014-06-03
  Administered 2014-06-03: 4 mg via INTRAVENOUS
  Filled 2014-06-03: qty 1

## 2014-06-03 MED ORDER — METOPROLOL TARTRATE 25 MG PO TABS: 25.0000 mg | ORAL_TABLET | Freq: Two times a day (BID) | ORAL | Status: DC

## 2014-06-03 MED ORDER — CLONIDINE HCL 0.2 MG PO TABS
0.2000 mg | ORAL_TABLET | Freq: Once | ORAL | Status: AC
  Administered 2014-06-03: 0.2 mg via ORAL
  Filled 2014-06-03: qty 1

## 2014-06-03 NOTE — Discharge Instructions (Signed)
Increase the amlodipine to 10 mg a day and add metoprolol 25 mg twice a day.   General Headache Without Cause A general headache is pain or discomfort felt around the head or neck area. The cause may not be found.  HOME CARE   Keep all doctor visits.  Only take medicines as told by your doctor.  Lie down in a dark, quiet room when you have a headache.  Keep a journal to find out if certain things bring on headaches. For example, write down:  What you eat and drink.  How much sleep you get.  Any change to your diet or medicines.  Relax by getting a massage or doing other relaxing activities.  Put ice or heat packs on the head and neck area as told by your doctor.  Lessen stress.  Sit up straight. Do not tighten (tense) your muscles.  Quit smoking if you smoke.  Lessen how much alcohol you drink.  Lessen how much caffeine you drink, or stop drinking caffeine.  Eat and sleep on a regular schedule.  Get 7 to 9 hours of sleep, or as told by your doctor.  Keep lights dim if bright lights bother you or make your headaches worse. GET HELP RIGHT AWAY IF:   Your headache becomes really bad.  You have a fever.  You have a stiff neck.  You have trouble seeing.  Your muscles are weak, or you lose muscle control.  You lose your balance or have trouble walking.  You feel like you will pass out (faint), or you pass out.  You have really bad symptoms that are different than your first symptoms.  You have problems with the medicines given to you by your doctor.  Your medicines do not work.  Your headache feels different than the other headaches.  You feel sick to your stomach (nauseous) or throw up (vomit). MAKE SURE YOU:   Understand these instructions.  Will watch your condition.  Will get help right away if you are not doing well or get worse. Document Released: 05/03/2008 Document Revised: 10/17/2011 Document Reviewed: 07/15/2011 [facility] Patient  Information 2015 [place], [place]. This information is not intended to replace advice given to you by your health care provider. Make sure you discuss any questions you have with your health care provider.  Hypertension Hypertension, commonly called high blood pressure, is when the force of blood pumping through your arteries is too strong. Your arteries are the blood vessels that carry blood from your heart throughout your body. A blood pressure reading consists of a higher number over a lower number, such as 110/72. The higher number (systolic) is the pressure inside your arteries when your heart pumps. The lower number (diastolic) is the pressure inside your arteries when your heart relaxes. Ideally you want your blood pressure below 120/80. Hypertension forces your heart to work harder to pump blood. Your arteries may become narrow or stiff. Having hypertension puts you at risk for heart disease, stroke, and other problems.  RISK FACTORS Some risk factors for high blood pressure are controllable. Others are not.  Risk factors you cannot control include:   Race. You may be at higher risk if you are African American.  Age. Risk increases with age.  Gender. Men are at higher risk than women before age 67 years. After age 82, women are at higher risk than men. Risk factors you can control include:  Not getting enough exercise or physical activity.  Being overweight.  Getting too  much fat, sugar, calories, or salt in your diet.  Drinking too much alcohol. SIGNS AND SYMPTOMS Hypertension does not usually cause signs or symptoms. Extremely high blood pressure (hypertensive crisis) may cause headache, anxiety, shortness of breath, and nosebleed. DIAGNOSIS  To check if you have hypertension, your health care provider will measure your blood pressure while you are seated, with your arm held at the level of your heart. It should be measured at least twice using the same arm. Certain conditions can  cause a difference in blood pressure between your right and left arms. A blood pressure reading that is higher than normal on one occasion does not mean that you need treatment. If one blood pressure reading is high, ask your health care provider about having it checked again. TREATMENT  Treating high blood pressure includes making lifestyle changes and possibly taking medicine. Living a healthy lifestyle can help lower high blood pressure. You may need to change some of your habits. Lifestyle changes may include:  Following the DASH diet. This diet is high in fruits, vegetables, and whole grains. It is low in salt, red meat, and added sugars.  Getting at least 2 hours of brisk physical activity every week.  Losing weight if necessary.  Not smoking.  Limiting alcoholic beverages.  Learning ways to reduce stress. If lifestyle changes are not enough to get your blood pressure under control, your health care provider may prescribe medicine. You may need to take more than one. Work closely with your health care provider to understand the risks and benefits. HOME CARE INSTRUCTIONS  Have your blood pressure rechecked as directed by your health care provider.   Take medicines only as directed by your health care provider. Follow the directions carefully. Blood pressure medicines must be taken as prescribed. The medicine does not work as well when you skip doses. Skipping doses also puts you at risk for problems.   Do not smoke.   Monitor your blood pressure at home as directed by your health care provider. SEEK MEDICAL CARE IF:   You think you are having a reaction to medicines taken.  You have recurrent headaches or feel dizzy.  You have swelling in your ankles.  You have trouble with your vision. SEEK IMMEDIATE MEDICAL CARE IF:  You develop a severe headache or confusion.  You have unusual weakness, numbness, or feel faint.  You have severe chest or abdominal pain.  You  vomit repeatedly.  You have trouble breathing. MAKE SURE YOU:   Understand these instructions.  Will watch your condition.  Will get help right away if you are not doing well or get worse. Document Released: 07/25/2005 Document Revised: 12/09/2013 Document Reviewed: 05/17/2013 [facility] Patient Information 2015 [place], [place]. This information is not intended to replace advice given to you by your health care provider. Make sure you discuss any questions you have with your health care provider.

## 2014-06-03 NOTE — ED Notes (Signed)
PT reports  Headache x8 days and high B/P and black spots in vision. PT homehealth nurse sent pt to ED for increased b/p reading.

## 2014-06-03 NOTE — ED Provider Notes (Signed)
CSN: [id number]     Arrival date & time 06/03/14  1604 History   First MD Initiated Contact with Patient 06/03/14 1746     Chief Complaint  Patient presents with  . Hypertension     (Consider location/radiation/quality/duration/timing/severity/associated sxs/prior Treatment) Patient is a 38 y.o. female presenting with hypertension. The history is provided by the patient.  Hypertension Associated symptoms include headaches. Pertinent negatives include no chest pain, no abdominal pain and no shortness of breath.   patient presents with high blood pressure headache and some vision changes. States she was recently diagnosed with hypertension during an MS exacerbation and was started on Norvasc. Had left-sided weakness and left-sided facial droop. Had some black spots in her right eye but then there since beginning of her recent admission. Also is having difficulty speaking. Has chronic left-sided contractions due to her MS. Throbbing headache. She has recently been on steroids for MS.  Past Medical History  Diagnosis Date  . MS (multiple sclerosis)   . Stroke   . Anxiety   . Bulging disc   . Headache   . Syncope    Past Surgical History  Procedure Laterality Date  . Tubal ligation    . Wisdonm teeth     Family History  Problem Relation Age of Onset  . Hypertension Mother   . Diabetes Mellitus II Mother   . Heart failure Mother   . Other Father    History  Substance Use Topics  . Smoking status: Current Every Day Smoker -- 0.50 packs/day for 19 years    Types: Cigarettes  . Smokeless tobacco: Never Used  . Alcohol Use: No   OB History   Grav Para Term Preterm Abortions TAB SAB Ect Mult Living                 Review of Systems  Constitutional: Positive for activity change.  Eyes: Positive for visual disturbance.  Respiratory: Negative for shortness of breath.   Cardiovascular: Negative for chest pain.  Gastrointestinal: Negative for abdominal pain.  Musculoskeletal:  Negative for back pain.  Skin: Negative for wound.  Neurological: Positive for weakness and headaches.  Hematological: Negative for adenopathy.      Allergies  Naproxen; Codeine; Penicillins; and Sulfa antibiotics  Home Medications   Prior to Admission medications   Medication Sig Start Date End Date Taking? Authorizing Provider  acetaminophen (TYLENOL) 500 MG tablet Take 500 mg by mouth every 6 (six) hours as needed for headache.   Yes Historical Provider, MD  albuterol (PROVENTIL HFA;VENTOLIN HFA) 108 (90 BASE) MCG/ACT inhaler Inhale 2 puffs into the lungs every 6 (six) hours as needed (asthma). Asthma.   Yes Historical Provider, MD  albuterol (PROVENTIL) (2.5 MG/3ML) 0.083% nebulizer solution Take 2.5 mg by nebulization every 6 (six) hours as needed (asthma). Asthma.   Yes Historical Provider, MD  aspirin 81 MG chewable tablet Chew 81 mg by mouth daily.   Yes Historical Provider, MD  ibuprofen (ADVIL,MOTRIN) 200 MG tablet Take 200 mg by mouth every 6 (six) hours as needed for headache.   Yes Historical Provider, MD  Multiple Vitamins-Minerals (CENTRUM ULTRA WOMENS) TABS Take 1 tablet by mouth daily.   Yes Historical Provider, MD  amLODipine (NORVASC) 5 MG tablet Take 2 tablets (10 mg total) by mouth daily. 06/03/14   Juliet Rude. Zadiel Leyh, MD  ciprofloxacin (CIPRO) 500 MG tablet Take 1 tablet (500 mg total) by mouth 2 (two) times daily. 05/27/14   Jeralyn Bennett, MD  methylPREDNISolone sodium succinate  1,000 mg in sodium chloride 0.9 % 50 mL Inject 1,000 mg into the vein daily. 05/27/14   Jeralyn BennettEzequiel Zamora, MD  metoprolol (LOPRESSOR) 12.5 mg TABS tablet Take 4 tablets (100 mg total) by mouth 2 (two) times daily. 06/03/14   Juliet RudeNathan R. Faith Patricelli, MD  oxyCODONE-acetaminophen (ROXICET) 5-325 MG per tablet Take 1 tablet by mouth every 4 (four) hours as needed. 05/27/14   Jeralyn BennettEzequiel Zamora, MD   BP 122/87  Pulse 95  Temp(Src) 98.3 F (36.8 C) (Oral)  Resp 24  Ht 4\' 6"  (1.372 m)  Wt 88 lb  (39.917 kg)  BMI 21.21 kg/m2  SpO2 100%  LMP 05/27/2014 Physical Exam  Constitutional: She is oriented to person, place, and time. She appears well-developed and well-nourished.  Cardiovascular:  Tachycardia  Abdominal: Soft.  Neurological: She is alert and oriented to person, place, and time.  Chronic contractions to left side. Left sided facial droop. External ocular movements intact on right side. Left eye chronically closed.  Skin: Skin is warm.    ED Course  Procedures (including critical care time) Labs Review Labs Reviewed  CBC WITH DIFFERENTIAL - Abnormal; Notable for the following:    WBC 12.9 (*)    Hemoglobin 15.4 (*)    Eosinophils Relative 7 (*)    Eosinophils Absolute 0.9 (*)    All other components within normal limits  BASIC METABOLIC PANEL - Abnormal; Notable for the following:    Sodium 134 (*)    Chloride 95 (*)    All other components within normal limits  URINALYSIS, ROUTINE W REFLEX MICROSCOPIC - Abnormal; Notable for the following:    Leukocytes, UA SMALL (*)    All other components within normal limits  URINE MICROSCOPIC-ADD ON - Abnormal; Notable for the following:    Squamous Epithelial / LPF FEW (*)    Bacteria, UA FEW (*)    All other components within normal limits  URINE CULTURE    Imaging Review No results found.   EKG Interpretation   Date/Time:  Tuesday June 03 2014 17:36:43 EDT Ventricular Rate:  101 PR Interval:  98 QRS Duration: 93 QT Interval:  361 QTC Calculation: 468 R Axis:   78 Text Interpretation:  Sinus tachycardia Confirmed by Rubin PayorPICKERING  MD, Harrold DonathNATHAN  [phone] on 06/03/2014 8:39:32 PM      MDM   Final diagnoses:  Essential hypertension  Acute nonintractable headache, unspecified headache type    Patient with hypertension. Moderately severe. Discussed with internal medicine and will adjust medications. Has chronic neuro deficits due to her MS. Patient's blood pressures improved after treatment and headaches  improved after Compazine. Will discharge home    Juliet Rudeathan R. Rubin PayorPickering, MD 06/03/14 2240

## 2014-06-03 NOTE — ED Notes (Addendum)
Patient here due to blood pressure. Per patient home health nurse came to check blood pressure today and it was 183/118. Patient told to come directly to ED by [facility]. Patient has had stroke in 2006 effecting left side. Per husband patient has not walked since. Patient recently seen at [facility] for stroke symptoms and sent home with Norvasc 5mg  to take once a day. Patient reports headache and seeing "blurred vision" since being discharged from [facility] last week.

## 2014-06-05 LAB — URINE CULTURE: Colony Count: 15000

## 2019-05-18 ENCOUNTER — Emergency Department (HOSPITAL_COMMUNITY): Payer: Medicaid Other

## 2019-05-18 ENCOUNTER — Emergency Department (HOSPITAL_COMMUNITY)
Admission: EM | Admit: 2019-05-18 | Discharge: 2019-05-18 | Disposition: A | Discharge status: Home / Self Care | Payer: Medicaid Other | Attending: Emergency Medicine | Admitting: Emergency Medicine

## 2019-05-18 ENCOUNTER — Other Ambulatory Visit: Payer: Self-pay

## 2019-05-18 ENCOUNTER — Encounter (HOSPITAL_COMMUNITY): Payer: Self-pay

## 2019-05-18 DIAGNOSIS — Z23 Encounter for immunization: Secondary | ICD-10-CM | POA: Insufficient documentation

## 2019-05-18 DIAGNOSIS — S82302A Unspecified fracture of lower end of left tibia, initial encounter for closed fracture: Secondary | ICD-10-CM | POA: Insufficient documentation

## 2019-05-18 DIAGNOSIS — S0081XA Abrasion of other part of head, initial encounter: Secondary | ICD-10-CM

## 2019-05-18 DIAGNOSIS — S8001XA Contusion of right knee, initial encounter: Secondary | ICD-10-CM

## 2019-05-18 DIAGNOSIS — S92001A Unspecified fracture of right calcaneus, initial encounter for closed fracture: Secondary | ICD-10-CM | POA: Diagnosis not present

## 2019-05-18 DIAGNOSIS — S92011A Displaced fracture of body of right calcaneus, initial encounter for closed fracture: Secondary | ICD-10-CM | POA: Diagnosis not present

## 2019-05-18 DIAGNOSIS — S8991XA Unspecified injury of right lower leg, initial encounter: Secondary | ICD-10-CM | POA: Diagnosis not present

## 2019-05-18 DIAGNOSIS — R519 Headache, unspecified: Secondary | ICD-10-CM | POA: Insufficient documentation

## 2019-05-18 DIAGNOSIS — I10 Essential (primary) hypertension: Secondary | ICD-10-CM | POA: Diagnosis not present

## 2019-05-18 DIAGNOSIS — S99922A Unspecified injury of left foot, initial encounter: Secondary | ICD-10-CM | POA: Diagnosis not present

## 2019-05-18 DIAGNOSIS — Y92410 Unspecified street and highway as the place of occurrence of the external cause: Secondary | ICD-10-CM | POA: Insufficient documentation

## 2019-05-18 DIAGNOSIS — F17210 Nicotine dependence, cigarettes, uncomplicated: Secondary | ICD-10-CM | POA: Insufficient documentation

## 2019-05-18 DIAGNOSIS — S01511A Laceration without foreign body of lip, initial encounter: Secondary | ICD-10-CM | POA: Diagnosis not present

## 2019-05-18 DIAGNOSIS — S5011XD Contusion of right forearm, subsequent encounter: Secondary | ICD-10-CM | POA: Diagnosis not present

## 2019-05-18 DIAGNOSIS — S59911A Unspecified injury of right forearm, initial encounter: Secondary | ICD-10-CM | POA: Diagnosis not present

## 2019-05-18 DIAGNOSIS — S82392A Other fracture of lower end of left tibia, initial encounter for closed fracture: Secondary | ICD-10-CM | POA: Diagnosis not present

## 2019-05-18 DIAGNOSIS — S0990XA Unspecified injury of head, initial encounter: Secondary | ICD-10-CM | POA: Diagnosis not present

## 2019-05-18 DIAGNOSIS — Z79899 Other long term (current) drug therapy: Secondary | ICD-10-CM | POA: Insufficient documentation

## 2019-05-18 DIAGNOSIS — S8252XA Displaced fracture of medial malleolus of left tibia, initial encounter for closed fracture: Secondary | ICD-10-CM | POA: Diagnosis not present

## 2019-05-18 DIAGNOSIS — Y93I9 Activity, other involving external motion: Secondary | ICD-10-CM | POA: Insufficient documentation

## 2019-05-18 DIAGNOSIS — S5011XA Contusion of right forearm, initial encounter: Secondary | ICD-10-CM | POA: Diagnosis not present

## 2019-05-18 DIAGNOSIS — Y999 Unspecified external cause status: Secondary | ICD-10-CM | POA: Insufficient documentation

## 2019-05-18 DIAGNOSIS — Z743 Need for continuous supervision: Secondary | ICD-10-CM | POA: Diagnosis not present

## 2019-05-18 DIAGNOSIS — S199XXA Unspecified injury of neck, initial encounter: Secondary | ICD-10-CM | POA: Diagnosis not present

## 2019-05-18 DIAGNOSIS — R52 Pain, unspecified: Secondary | ICD-10-CM | POA: Diagnosis not present

## 2019-05-18 MED ORDER — OXYCODONE-ACETAMINOPHEN 5-325 MG PO TABS
1.0000 | ORAL_TABLET | Freq: Once | ORAL | Status: AC
  Administered 2019-05-18: 10:00:00 1 via ORAL
  Filled 2019-05-18: qty 1

## 2019-05-18 MED ORDER — FENTANYL CITRATE (PF) 100 MCG/2ML IJ SOLN
50.0000 ug | Freq: Once | INTRAMUSCULAR | Status: AC
  Administered 2019-05-18: 08:00:00 50 ug via INTRAVENOUS
  Filled 2019-05-18: qty 2

## 2019-05-18 MED ORDER — TETANUS-DIPHTH-ACELL PERTUSSIS 5-2.5-18.5 LF-MCG/0.5 IM SUSP
0.5000 mL | Freq: Once | INTRAMUSCULAR | Status: AC
  Administered 2019-05-18: 04:00:00 0.5 mL via INTRAMUSCULAR
  Filled 2019-05-18: qty 0.5

## 2019-05-18 MED ORDER — LIDOCAINE-EPINEPHRINE (PF) 2 %-1:200000 IJ SOLN
10.0000 mL | Freq: Once | INTRAMUSCULAR | Status: AC
  Administered 2019-05-18: 10 mL
  Filled 2019-05-18: qty 10

## 2019-05-18 MED ORDER — PERCOCET 5-325 MG PO TABS: 1.0000 | ORAL_TABLET | Freq: Four times a day (QID) | ORAL | 0 refills | Status: DC | PRN

## 2019-05-18 MED ORDER — CLONIDINE HCL 0.1 MG PO TABS
0.1000 mg | ORAL_TABLET | Freq: Once | ORAL | Status: AC
  Administered 2019-05-18: 05:00:00 0.1 mg via ORAL
  Filled 2019-05-18: qty 1

## 2019-05-18 MED ORDER — OXYCODONE-ACETAMINOPHEN 5-325 MG PO TABS
1.0000 | ORAL_TABLET | Freq: Once | ORAL | Status: AC
  Administered 2019-05-18: 05:00:00 1 via ORAL
  Filled 2019-05-18: qty 1

## 2019-05-18 MED ORDER — ERYTHROMYCIN BASE 250 MG PO TABS: 250.0000 mg | ORAL_TABLET | Freq: Three times a day (TID) | ORAL | 0 refills | Status: AC

## 2019-05-18 MED ORDER — WHEELCHAIR MISC: 0 refills | Status: DC

## 2019-05-18 MED ORDER — FENTANYL CITRATE (PF) 100 MCG/2ML IJ SOLN
25.0000 ug | Freq: Once | INTRAMUSCULAR | Status: AC
  Administered 2019-05-18: 04:00:00 25 ug via INTRAVENOUS
  Filled 2019-05-18: qty 2
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
## 2019-05-18 NOTE — ED Notes (Signed)
Patient transported to CT 

## 2019-05-18 NOTE — ED Notes (Signed)
Patient transported to X-ray 

## 2019-05-18 NOTE — ED Notes (Signed)
ED Provider at bedside. 

## 2019-05-18 NOTE — Discharge Instructions (Addendum)
Elevate your legs, use ice packs to keep the swelling down and help with pain.  Take the Percocet as prescribed.  Please make sure you take your blood pressure medication.  You need to follow-up with her orthopedic doctor.  I gave you the name of 1 here in [place] and also the one on call in [place].  Leave the splint on until you see the orthopedist. DO NOT [place]!!!  You will need to use a wheelchair to get around.  The sutures in your mouth will dissolve although it can take a couple weeks.  Take the antibiotics to help prevent an infection in your mouth.

## 2019-05-23 ENCOUNTER — Other Ambulatory Visit: Payer: Self-pay

## 2019-05-23 ENCOUNTER — Ambulatory Visit (INDEPENDENT_AMBULATORY_CARE_PROVIDER_SITE_OTHER): Payer: Medicaid Other | Admitting: Orthopaedic Surgery

## 2019-05-23 ENCOUNTER — Encounter: Payer: Self-pay | Admitting: Orthopaedic Surgery

## 2019-05-23 VITALS — BP 107/69 | HR 50 | Temp 97.5°F | Ht <= 58 in

## 2019-05-23 DIAGNOSIS — S8252XA Displaced fracture of medial malleolus of left tibia, initial encounter for closed fracture: Secondary | ICD-10-CM | POA: Diagnosis not present

## 2019-05-23 DIAGNOSIS — S92001A Unspecified fracture of right calcaneus, initial encounter for closed fracture: Secondary | ICD-10-CM | POA: Diagnosis not present

## 2019-05-23 DIAGNOSIS — Z7689 Persons encountering health services in other specified circumstances: Secondary | ICD-10-CM | POA: Diagnosis not present

## 2019-05-23 MED ORDER — PERCOCET 5-325 MG PO TABS: 1.0000 | ORAL_TABLET | Freq: Four times a day (QID) | ORAL | 0 refills | Status: DC | PRN
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
## 2019-05-27 ENCOUNTER — Ambulatory Visit (INDEPENDENT_AMBULATORY_CARE_PROVIDER_SITE_OTHER): Payer: Medicaid Other

## 2019-05-27 ENCOUNTER — Ambulatory Visit: Payer: Self-pay

## 2019-05-27 ENCOUNTER — Encounter: Payer: Self-pay | Admitting: Orthopedic Surgery

## 2019-05-27 ENCOUNTER — Ambulatory Visit (INDEPENDENT_AMBULATORY_CARE_PROVIDER_SITE_OTHER): Payer: Medicaid Other | Admitting: Orthopedic Surgery

## 2019-05-27 ENCOUNTER — Other Ambulatory Visit: Payer: Self-pay

## 2019-05-27 VITALS — Ht <= 58 in | Wt 80.0 lb

## 2019-05-27 DIAGNOSIS — M79671 Pain in right foot: Secondary | ICD-10-CM | POA: Diagnosis not present

## 2019-05-27 DIAGNOSIS — M79672 Pain in left foot: Secondary | ICD-10-CM

## 2019-05-27 DIAGNOSIS — S8255XA Nondisplaced fracture of medial malleolus of left tibia, initial encounter for closed fracture: Secondary | ICD-10-CM

## 2019-05-27 DIAGNOSIS — S92011A Displaced fracture of body of right calcaneus, initial encounter for closed fracture: Secondary | ICD-10-CM

## 2019-05-27 MED ORDER — OXYCODONE-ACETAMINOPHEN 5-325 MG PO TABS: 1.0000 | ORAL_TABLET | ORAL | 0 refills | Status: DC | PRN
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
## 2019-05-28 ENCOUNTER — Other Ambulatory Visit: Payer: Self-pay | Admitting: Physician Assistant

## 2019-05-28 ENCOUNTER — Other Ambulatory Visit (HOSPITAL_COMMUNITY)
Admission: RE | Admit: 2019-05-28 | Discharge: 2019-05-28 | Disposition: A | Discharge status: Home / Self Care | Payer: Medicaid Other | Source: Ambulatory Visit | Attending: Orthopedic Surgery | Admitting: Orthopedic Surgery

## 2019-05-28 ENCOUNTER — Encounter (HOSPITAL_COMMUNITY): Payer: Self-pay | Admitting: *Deleted

## 2019-05-28 DIAGNOSIS — Z20828 Contact with and (suspected) exposure to other viral communicable diseases: Secondary | ICD-10-CM | POA: Insufficient documentation

## 2019-05-28 LAB — SARS CORONAVIRUS 2 (TAT 6-24 HRS): SARS Coronavirus 2: NEGATIVE
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
## 2019-05-28 NOTE — Progress Notes (Addendum)
[name] denies chest pain or shortness of breath.  Patient states that  she nor anyone who lives in the house has had any s/s of Covid.  Patient will go to [facility] today for [employer],.  [name] has history of MS- she is not taking any medications.  Patient states she has had several strokes due to MS.  Patient states she has left side weakness.The last admission with stroke symptoms was in 2015- MRI did not confirm Stroke. Patient no longer sees a neurologist.  Brianna. Reilly has a history of asthma, patient coughed several times while we were on the phone.  [name] reports that she is coughing because it was 93 degrees in her house this am.  I encouraged patient to use Albuterol Nebulizer in am if she is still coughing.  [name] has history of HTN, she is out of blood pressure medications and will not be getting any today.  Patient's PCP is Dr. Lorriane Shire; "I need a new one".  I asked anesthesia PA- C to review chart.  [name] asked how to find a new Dr. I left a voice message for patient to call [phone]

## 2019-05-29 ENCOUNTER — Encounter (HOSPITAL_COMMUNITY): Payer: Self-pay

## 2019-05-29 ENCOUNTER — Ambulatory Visit (HOSPITAL_COMMUNITY): Payer: Medicaid Other | Admitting: Physician Assistant

## 2019-05-29 ENCOUNTER — Other Ambulatory Visit: Payer: Self-pay

## 2019-05-29 ENCOUNTER — Ambulatory Visit (HOSPITAL_COMMUNITY)
Admission: RE | Admit: 2019-05-29 | Discharge: 2019-05-29 | Disposition: A | Discharge status: Home / Self Care | Payer: Medicaid Other | Attending: Orthopedic Surgery | Admitting: Orthopedic Surgery

## 2019-05-29 ENCOUNTER — Encounter (HOSPITAL_COMMUNITY)
Admission: RE | Disposition: A | Discharge status: Home / Self Care | Payer: Self-pay | Source: Home / Self Care | Attending: Orthopedic Surgery

## 2019-05-29 DIAGNOSIS — Z882 Allergy status to sulfonamides status: Secondary | ICD-10-CM | POA: Diagnosis not present

## 2019-05-29 DIAGNOSIS — G8918 Other acute postprocedural pain: Secondary | ICD-10-CM | POA: Diagnosis not present

## 2019-05-29 DIAGNOSIS — Z885 Allergy status to narcotic agent status: Secondary | ICD-10-CM | POA: Diagnosis not present

## 2019-05-29 DIAGNOSIS — Z8249 Family history of ischemic heart disease and other diseases of the circulatory system: Secondary | ICD-10-CM | POA: Insufficient documentation

## 2019-05-29 DIAGNOSIS — Z880 Allergy status to penicillin: Secondary | ICD-10-CM | POA: Insufficient documentation

## 2019-05-29 DIAGNOSIS — I639 Cerebral infarction, unspecified: Secondary | ICD-10-CM | POA: Diagnosis not present

## 2019-05-29 DIAGNOSIS — Y9389 Activity, other specified: Secondary | ICD-10-CM | POA: Diagnosis not present

## 2019-05-29 DIAGNOSIS — F329 Major depressive disorder, single episode, unspecified: Secondary | ICD-10-CM | POA: Diagnosis not present

## 2019-05-29 DIAGNOSIS — F17210 Nicotine dependence, cigarettes, uncomplicated: Secondary | ICD-10-CM | POA: Insufficient documentation

## 2019-05-29 DIAGNOSIS — G35 Multiple sclerosis: Secondary | ICD-10-CM | POA: Insufficient documentation

## 2019-05-29 DIAGNOSIS — R55 Syncope and collapse: Secondary | ICD-10-CM | POA: Diagnosis not present

## 2019-05-29 DIAGNOSIS — I10 Essential (primary) hypertension: Secondary | ICD-10-CM | POA: Insufficient documentation

## 2019-05-29 DIAGNOSIS — F419 Anxiety disorder, unspecified: Secondary | ICD-10-CM | POA: Insufficient documentation

## 2019-05-29 DIAGNOSIS — S92011A Displaced fracture of body of right calcaneus, initial encounter for closed fracture: Secondary | ICD-10-CM

## 2019-05-29 DIAGNOSIS — S8255XA Nondisplaced fracture of medial malleolus of left tibia, initial encounter for closed fracture: Secondary | ICD-10-CM

## 2019-05-29 DIAGNOSIS — J45909 Unspecified asthma, uncomplicated: Secondary | ICD-10-CM | POA: Diagnosis not present

## 2019-05-29 DIAGNOSIS — S8252XA Displaced fracture of medial malleolus of left tibia, initial encounter for closed fracture: Secondary | ICD-10-CM | POA: Diagnosis not present

## 2019-05-29 DIAGNOSIS — Z833 Family history of diabetes mellitus: Secondary | ICD-10-CM | POA: Diagnosis not present

## 2019-05-29 DIAGNOSIS — I69354 Hemiplegia and hemiparesis following cerebral infarction affecting left non-dominant side: Secondary | ICD-10-CM | POA: Insufficient documentation

## 2019-05-29 DIAGNOSIS — Z886 Allergy status to analgesic agent status: Secondary | ICD-10-CM | POA: Diagnosis not present

## 2019-05-29 HISTORY — DX: Pneumonia, unspecified organism: J18.9

## 2019-05-29 HISTORY — DX: Essential (primary) hypertension: I10

## 2019-05-29 HISTORY — DX: Depression, unspecified: F32.A

## 2019-05-29 HISTORY — DX: Personal history of other medical treatment: Z92.89

## 2019-05-29 HISTORY — PX: ORIF ANKLE FRACTURE: SHX5408

## 2019-05-29 HISTORY — DX: Unspecified asthma, uncomplicated: J45.909

## 2019-05-29 LAB — BASIC METABOLIC PANEL
Anion gap: 15 (ref 5–15)
BUN: 11 mg/dL (ref 6–20)
CO2: 18 mmol/L — ABNORMAL LOW (ref 22–32)
Calcium: 9.7 mg/dL (ref 8.9–10.3)
Chloride: 109 mmol/L (ref 98–111)
Creatinine, Ser: 0.6 mg/dL (ref 0.44–1.00)
GFR calc Af Amer: >60 mL/min (ref >60)
GFR calc non Af Amer: >60 mL/min (ref >60)
Glucose, Bld: 88 mg/dL (ref 70–99)
Potassium: 5.1 mmol/L (ref 3.5–5.1)
Sodium: 142 mmol/L (ref 135–145)

## 2019-05-29 LAB — CBC
HCT: 41.4 % (ref 36.0–46.0)
Hemoglobin: 13.1 g/dL (ref 12.0–15.0)
MCH: 30.7 pg (ref 26.0–34.0)
MCHC: 31.6 g/dL (ref 30.0–36.0)
MCV: 97 fL (ref 80.0–100.0)
Platelets: 433 10*3/uL — ABNORMAL HIGH (ref 150–400)
RBC: 4.27 MIL/uL (ref 3.87–5.11)
RDW: 13.3 % (ref 11.5–15.5)
WBC: 9 10*3/uL (ref 4.0–10.5)
nRBC: 0 % (ref 0.0–0.2)

## 2019-05-29 LAB — POCT PREGNANCY, URINE: Preg Test, Ur: NEGATIVE

## 2019-05-29 SURGERY — OPEN REDUCTION INTERNAL FIXATION (ORIF) ANKLE FRACTURE
Anesthesia: General | Site: Ankle | Laterality: Left

## 2019-05-29 MED ORDER — CHLORHEXIDINE GLUCONATE 4 % EX LIQD: 60.0000 mL | Freq: Once | CUTANEOUS | Status: DC

## 2019-05-29 MED ORDER — OXYCODONE-ACETAMINOPHEN 5-325 MG PO TABS: 1.0000 | ORAL_TABLET | ORAL | 0 refills | Status: DC | PRN

## 2019-05-29 MED ORDER — FENTANYL CITRATE (PF) 250 MCG/5ML IJ SOLN
INTRAMUSCULAR | Status: DC | PRN
  Administered 2019-05-29: 50 ug via INTRAVENOUS

## 2019-05-29 MED ORDER — PROPOFOL 10 MG/ML IV BOLUS
INTRAVENOUS | Status: DC | PRN
  Administered 2019-05-29: 120 mg via INTRAVENOUS
  Administered 2019-05-29: 20 mg via INTRAVENOUS

## 2019-05-29 MED ORDER — CLINDAMYCIN PHOSPHATE 900 MG/50ML IV SOLN
INTRAVENOUS | Status: AC
  Filled 2019-05-29: qty 50

## 2019-05-29 MED ORDER — HYDROMORPHONE HCL 1 MG/ML IJ SOLN: 0.2500 mg | INTRAMUSCULAR | Status: DC | PRN

## 2019-05-29 MED ORDER — MEPERIDINE HCL 25 MG/ML IJ SOLN: 6.2500 mg | INTRAMUSCULAR | Status: DC | PRN

## 2019-05-29 MED ORDER — METOPROLOL TARTRATE 25 MG PO TABS: 25.0000 mg | ORAL_TABLET | Freq: Two times a day (BID) | ORAL | 3 refills | Status: DC

## 2019-05-29 MED ORDER — PROMETHAZINE HCL 25 MG/ML IJ SOLN: 6.2500 mg | INTRAMUSCULAR | Status: DC | PRN

## 2019-05-29 MED ORDER — POVIDONE-IODINE 10 % EX SWAB: 2.0000 "application " | Freq: Once | CUTANEOUS | Status: DC

## 2019-05-29 MED ORDER — CLINDAMYCIN PHOSPHATE 900 MG/50ML IV SOLN
900.0000 mg | INTRAVENOUS | Status: AC
  Administered 2019-05-29: 14:00:00 900 mg via INTRAVENOUS

## 2019-05-29 MED ORDER — ROPIVACAINE HCL 5 MG/ML IJ SOLN
INTRAMUSCULAR | Status: DC | PRN
  Administered 2019-05-29: 50 mL via PERINEURAL

## 2019-05-29 MED ORDER — AMLODIPINE BESYLATE 5 MG PO TABS: 5.0000 mg | ORAL_TABLET | Freq: Every day | ORAL | 3 refills | Status: DC

## 2019-05-29 MED ORDER — ONDANSETRON HCL 4 MG/2ML IJ SOLN
INTRAMUSCULAR | Status: DC | PRN
  Administered 2019-05-29: 4 mg via INTRAVENOUS

## 2019-05-29 MED ORDER — MIDAZOLAM HCL 2 MG/2ML IJ SOLN
2.0000 mg | Freq: Once | INTRAMUSCULAR | Status: AC
  Administered 2019-05-29: 13:00:00 2 mg via INTRAVENOUS

## 2019-05-29 MED ORDER — LIDOCAINE 2% (20 MG/ML) 5 ML SYRINGE
INTRAMUSCULAR | Status: DC | PRN
  Administered 2019-05-29: 60 mg via INTRAVENOUS

## 2019-05-29 MED ORDER — DEXAMETHASONE SODIUM PHOSPHATE 10 MG/ML IJ SOLN
INTRAMUSCULAR | Status: DC | PRN
  Administered 2019-05-29: 5 mg via INTRAVENOUS

## 2019-05-29 MED ORDER — PHENYLEPHRINE HCL (PRESSORS) 10 MG/ML IV SOLN
INTRAVENOUS | Status: DC | PRN
  Administered 2019-05-29 (×3): 80 ug via INTRAVENOUS

## 2019-05-29 MED ORDER — FENTANYL CITRATE (PF) 100 MCG/2ML IJ SOLN
INTRAMUSCULAR | Status: AC
  Administered 2019-05-29: 100 ug via INTRAVENOUS
  Filled 2019-05-29: qty 2

## 2019-05-29 MED ORDER — MIDAZOLAM HCL 2 MG/2ML IJ SOLN
INTRAMUSCULAR | Status: AC
  Filled 2019-05-29: qty 2

## 2019-05-29 MED ORDER — LACTATED RINGERS IV SOLN
INTRAVENOUS | Status: DC
  Administered 2019-05-29: 12:00:00 via INTRAVENOUS

## 2019-05-29 MED ORDER — MIDAZOLAM HCL 2 MG/2ML IJ SOLN
INTRAMUSCULAR | Status: AC
  Administered 2019-05-29: 13:00:00 2 mg via INTRAVENOUS
  Filled 2019-05-29: qty 2

## 2019-05-29 MED ORDER — FENTANYL CITRATE (PF) 100 MCG/2ML IJ SOLN
100.0000 ug | Freq: Once | INTRAMUSCULAR | Status: AC
  Administered 2019-05-29: 13:00:00 100 ug via INTRAVENOUS

## 2019-05-29 MED ORDER — OXYCODONE HCL 5 MG PO TABS: 5.0000 mg | ORAL_TABLET | Freq: Once | ORAL | Status: DC | PRN

## 2019-05-29 MED ORDER — FENTANYL CITRATE (PF) 250 MCG/5ML IJ SOLN
INTRAMUSCULAR | Status: AC
  Filled 2019-05-29: qty 5

## 2019-05-29 MED ORDER — OXYCODONE HCL 5 MG/5ML PO SOLN: 5.0000 mg | Freq: Once | ORAL | Status: DC | PRN

## 2019-05-29 SURGICAL SUPPLY — 42 items
BANDAGE ESMARK 6X9 LF (GAUZE/BANDAGES/DRESSINGS) IMPLANT
BIT DRILL CANN 2.7X625 NONSTRL (BIT) ×2 IMPLANT
BNDG CMPR 9X6 STRL LF SNTH (GAUZE/BANDAGES/DRESSINGS)
BNDG COHESIVE 4X5 TAN STRL (GAUZE/BANDAGES/DRESSINGS) ×3 IMPLANT
BNDG ESMARK 6X9 LF (GAUZE/BANDAGES/DRESSINGS)
BNDG GAUZE ELAST 4 BULKY (GAUZE/BANDAGES/DRESSINGS) ×3 IMPLANT
COVER SURGICAL LIGHT HANDLE (MISCELLANEOUS) ×3 IMPLANT
COVER WAND RF STERILE (DRAPES) ×3 IMPLANT
DRAPE OEC MINIVIEW 54X84 (DRAPES) ×2 IMPLANT
DRAPE U-SHAPE 47X51 STRL (DRAPES) ×3 IMPLANT
DRSG ADAPTIC 3X8 NADH LF (GAUZE/BANDAGES/DRESSINGS) ×3 IMPLANT
DRSG PAD ABDOMINAL 8X10 ST (GAUZE/BANDAGES/DRESSINGS) ×3 IMPLANT
DURAPREP 26ML APPLICATOR (WOUND CARE) ×3 IMPLANT
ELECT REM PT RETURN 9FT ADLT (ELECTROSURGICAL) ×3
ELECTRODE REM PT RTRN 9FT ADLT (ELECTROSURGICAL) ×1 IMPLANT
GAUZE SPONGE 4X4 12PLY STRL (GAUZE/BANDAGES/DRESSINGS) ×3 IMPLANT
GAUZE SPONGE 4X4 12PLY STRL LF (GAUZE/BANDAGES/DRESSINGS) ×2 IMPLANT
GLOVE BIOGEL PI IND STRL 9 (GLOVE) ×1 IMPLANT
GLOVE BIOGEL PI INDICATOR 9 (GLOVE) ×2
GLOVE SURG ORTHO 9.0 STRL STRW (GLOVE) ×3 IMPLANT
GOWN STRL REUS W/ TWL XL LVL3 (GOWN DISPOSABLE) ×3 IMPLANT
GOWN STRL REUS W/TWL XL LVL3 (GOWN DISPOSABLE) ×9
GUIDEWARE NON THREAD 1.25X150 (WIRE) ×6
GUIDEWIRE NON THREAD 1.25X150 (WIRE) IMPLANT
KIT BASIN OR (CUSTOM PROCEDURE TRAY) ×3 IMPLANT
KIT TURNOVER KIT B (KITS) ×3 IMPLANT
MANIFOLD NEPTUNE II (INSTRUMENTS) ×3 IMPLANT
NS IRRIG 1000ML POUR BTL (IV SOLUTION) ×3 IMPLANT
PACK ORTHO EXTREMITY (CUSTOM PROCEDURE TRAY) ×3 IMPLANT
PAD ARMBOARD 7.5X6 YLW CONV (MISCELLANEOUS) ×6 IMPLANT
SCREW SHORT THREAD 4.0X40 (Screw) ×2 IMPLANT
SCREW SHORT THREAD 4.0X46 (Screw) ×2 IMPLANT
STAPLER VISISTAT 35W (STAPLE) IMPLANT
SUCTION FRAZIER HANDLE 10FR (MISCELLANEOUS) ×2
SUCTION TUBE FRAZIER 10FR DISP (MISCELLANEOUS) ×1 IMPLANT
SUT ETHILON 2 0 PSLX (SUTURE) IMPLANT
SUT VIC AB 2-0 CT1 27 (SUTURE) ×3
SUT VIC AB 2-0 CT1 TAPERPNT 27 (SUTURE) ×1 IMPLANT
TOWEL GREEN STERILE (TOWEL DISPOSABLE) ×3 IMPLANT
TOWEL GREEN STERILE FF (TOWEL DISPOSABLE) ×3 IMPLANT
TUBE CONNECTING 12'X1/4 (SUCTIONS) ×1
TUBE CONNECTING 12X1/4 (SUCTIONS) ×2 IMPLANT

## 2019-05-29 NOTE — Progress Notes (Signed)
Orthopedic Tech Progress Note Patient Details:  [name] [facility] 08-01-1976 [id number] PACU RN called requesting a small CAM WALKER for patient Ortho Devices Type of Ortho Device: CAM walker Ortho Device/Splint Location: LLE Ortho Device/Splint Interventions: Adjustment, Application, Ordered   Post Interventions Patient Tolerated: Well Instructions Provided: Care of device, Adjustment of device   Janit Pagan 05/29/2019, 3:23 PM
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
## 2019-05-29 NOTE — Anesthesia Procedure Notes (Signed)
Procedure Name: LMA Insertion Date/Time: 05/29/2019 2:04 PM Performed by: Babak Lucus T, CRNA Pre-anesthesia Checklist: Patient identified, Emergency Drugs available, Suction available and Patient being monitored Patient Re-evaluated:Patient Re-evaluated prior to induction Oxygen Delivery Method: Circle system utilized Preoxygenation: Pre-oxygenation with 100% oxygen Induction Type: IV induction Ventilation: Mask ventilation without difficulty LMA: LMA inserted LMA Size: 4.0 Number of attempts: 1 Airway Equipment and Method: Patient positioned with wedge pillow Placement Confirmation: positive ETCO2 and breath sounds checked- equal and bilateral Tube secured with: Tape Dental Injury: Teeth and Oropharynx as per pre-operative assessment

## 2019-05-29 NOTE — Transfer of Care (Signed)
Immediate Anesthesia Transfer of Care Note  Patient: Kassiah L Hagin  Procedure(s) Performed: OPEN REDUCTION INTERNAL FIXATION (ORIF) LEFT ANKLE FRACTURE (Left Ankle)  Patient Location: PACU  Anesthesia Type:GA combined with regional for post-op pain  Level of Consciousness: awake, alert  and oriented  Airway & Oxygen Therapy: Patient Spontanous Breathing and Patient connected to nasal cannula oxygen  Post-op Assessment: Report given to RN, Post -op Vital signs reviewed and stable and Patient moving all extremities  Post vital signs: Reviewed and stable  Last Vitals:  Vitals Value Taken Time  BP 159/143 05/29/19 1441  Temp    Pulse 90 05/29/19 1446  Resp 19 05/29/19 1446  SpO2 100 % 05/29/19 1446  Vitals shown include unvalidated device data.  Last Pain:  Vitals:   05/29/19 1119  TempSrc:   PainSc: 5       Patients Stated Pain Goal: 2 (70/17/79 3903)  Complications: No apparent anesthesia complications

## 2019-05-29 NOTE — Anesthesia Postprocedure Evaluation (Signed)
Anesthesia Post Note  Patient: [name]  Procedure(s) Performed: OPEN REDUCTION INTERNAL FIXATION (ORIF) LEFT ANKLE FRACTURE (Left Ankle)     Patient location during evaluation: PACU Anesthesia Type: General Level of consciousness: awake and alert Pain management: pain level controlled Vital Signs Assessment: post-procedure vital signs reviewed and stable Respiratory status: spontaneous breathing, nonlabored ventilation and respiratory function stable Cardiovascular status: blood pressure returned to baseline and stable Postop Assessment: no apparent nausea or vomiting Anesthetic complications: no    Last Vitals:  Vitals:   05/29/19 1533 05/29/19 1615  BP: (!) 147/102 (!) 142/111  Pulse: 85 84  Resp: 14 14  Temp:  (!) 36.2 C  SpO2: 99% 97%    Last Pain:  Vitals:   05/29/19 1610  TempSrc:   PainSc: 0-No pain                 Lynda Rainwater

## 2019-05-29 NOTE — Progress Notes (Signed)
BP 146/107 @ 1127  Dr. Sabra Heck notified of high BP.  No orders given at this time.

## 2019-05-29 NOTE — Anesthesia Procedure Notes (Signed)
Anesthesia Regional Block: Popliteal block   Pre-Anesthetic Checklist: ,, timeout performed, Correct Patient, Correct Site, Correct Laterality, Correct Procedure, Correct Position, site marked, Risks and benefits discussed,  Surgical consent,  Pre-op evaluation,  At surgeon's request and post-op pain management  Laterality: Left  Prep: chloraprep       Needles:  Injection technique: Single-shot  Needle Type: Stimiplex     Needle Length: 9cm  Needle Gauge: 21     Additional Needles:   Procedures:,,,, ultrasound used (permanent image in chart),,,,  Narrative:  Start time: 05/29/2019 1:28 PM End time: 05/29/2019 1:33 PM Injection made incrementally with aspirations every 5 mL.  Performed by: Personally  Anesthesiologist: Lynda Rainwater, MD

## 2019-05-29 NOTE — Anesthesia Procedure Notes (Signed)
Anesthesia Regional Block: Adductor canal block   Pre-Anesthetic Checklist: ,, timeout performed, Correct Patient, Correct Site, Correct Laterality, Correct Procedure, Correct Position, site marked, Risks and benefits discussed,  Surgical consent,  Pre-op evaluation,  At surgeon's request and post-op pain management  Laterality: Left  Prep: chloraprep       Needles:  Injection technique: Single-shot  Needle Type: Stimiplex     Needle Length: 9cm  Needle Gauge: 21     Additional Needles:   Procedures:,,,, ultrasound used (permanent image in chart),,,,  Narrative:  Start time: 05/29/2019 1:27 PM End time: 05/29/2019 1:32 PM Injection made incrementally with aspirations every 5 mL.  Performed by: Personally  Anesthesiologist: Lynda Rainwater, MD

## 2019-05-29 NOTE — Op Note (Signed)
05/29/2019  2:42 PM  PATIENT:  [name]    PRE-OPERATIVE DIAGNOSIS:  Medial Malleolus Left Ankle Fracture with extension into the weightbearing tibial plafond C-arm fluoroscopy to verify alignment  POST-OPERATIVE DIAGNOSIS:  Same  PROCEDURE:  OPEN REDUCTION INTERNAL FIXATION (ORIF) LEFT ANKLE FRACTURE  SURGEON:  Newt Minion, MD  PHYSICIAN ASSISTANT:None ANESTHESIA:   General  PREOPERATIVE INDICATIONS:  [name] is a  43 y.o. female with a diagnosis of Medial Malleolus Left Ankle Fracture who failed conservative measures and elected for surgical management.    The risks benefits and alternatives were discussed with the patient preoperatively including but not limited to the risks of infection, bleeding, nerve injury, cardiopulmonary complications, the need for revision surgery, among others, and the patient was willing to proceed.  OPERATIVE IMPLANTS: 4.0 cannulated screws x2  @ENCIMAGES @  OPERATIVE FINDINGS: Congruent joint without displacement after internal fixation  OPERATIVE PROCEDURE: Patient was brought to the operating room after undergoing a regional block she then underwent a general anesthetic.  After adequate levels anesthesia were obtained patient's left lower extremity was prepped using DuraPrep draped into a sterile field a timeout was called.  A small incision was made just distal to the medial malleolus.  The guidewire was inserted from the medial malleolus into the distal tibia.  C arthroscopy verified reduction and alignment.  A second guidewire was also placed.  C-arm fluoroscopy verified alignment of both guidewires and a 46 and 40 mm partially threaded 4.0 cannulated screw was used to stabilize the fracture C arm fluoroscopy verified reduction with a congruent mortise.  The wound was irrigated with normal saline incision was closed using 2-0 nylon a sterile compressive dressing was applied patient was taken to PACU in stable condition.   DISCHARGE  PLANNING:  Antibiotic duration: Preoperative antibiotics clindamycin  Weightbearing: Weightbearing as tolerated for transfers both lower extremities  Pain medication: Refill prescription for Percocet  Dressing care/ Wound VAC: Leave dressing in place until follow-up  Ambulatory devices: Full assistance with transfers  Discharge to: Home.  Follow-up: In the office 1 week post operative.

## 2019-05-30 ENCOUNTER — Encounter (HOSPITAL_COMMUNITY): Payer: Self-pay | Admitting: Orthopedic Surgery

## 2019-06-04 ENCOUNTER — Other Ambulatory Visit: Payer: Self-pay | Admitting: Orthopedic Surgery

## 2019-06-04 MED ORDER — OXYCODONE-ACETAMINOPHEN 5-325 MG PO TABS: 1.0000 | ORAL_TABLET | ORAL | 0 refills | Status: DC | PRN

## 2019-06-04 NOTE — Progress Notes (Signed)
Patient called stating that the 2 pharmacies in [place] did not have the [place].  This was called into [facility] on [address].  The other 2 prescriptions have been canceled.

## 2019-06-05 ENCOUNTER — Ambulatory Visit: Payer: Medicaid Other | Admitting: Family

## 2019-06-06 ENCOUNTER — Ambulatory Visit (INDEPENDENT_AMBULATORY_CARE_PROVIDER_SITE_OTHER): Payer: Medicaid Other | Admitting: Orthopedic Surgery

## 2019-06-06 ENCOUNTER — Other Ambulatory Visit: Payer: Self-pay | Admitting: Physician Assistant

## 2019-06-06 ENCOUNTER — Encounter: Payer: Self-pay | Admitting: Orthopedic Surgery

## 2019-06-06 ENCOUNTER — Other Ambulatory Visit: Payer: Self-pay

## 2019-06-06 VITALS — Ht <= 58 in | Wt 80.0 lb

## 2019-06-06 DIAGNOSIS — S8255XA Nondisplaced fracture of medial malleolus of left tibia, initial encounter for closed fracture: Secondary | ICD-10-CM

## 2019-06-06 NOTE — Progress Notes (Signed)
Office Visit Note   Patient: [name]           Date of Birth: 06/29/1976           MRN: [id number] Visit Date: 06/06/2019              Requested by: Lucia Gaskins, MD [address] [place],  [place] [address] PCP: Lucia Gaskins, MD   Assessment & Plan: Visit Diagnoses: No diagnosis found.  Plan: 1 week s/p ORIF left medial malleolus Fracture and non operative right calcaneus fracture. Medial wound well healed. Sutures removed. May WBAT on left and right side  Follow-Up Instructions: No follow-ups on file.  Orders:  No orders of the defined types were placed in this encounter.  No orders of the defined types were placed in this encounter.     Procedures: No procedures performed   Clinical Data: No additional findings.   Subjective: Chief Complaint  Patient presents with  . Left Ankle - Routine Post Op    05/29/2019 ORIF Left ankle fx    HPI1 week s/p ORIF left medial ankle fracture  Review of Systems   Objective: Vital Signs: Ht 4\' 9"  (1.448 m)   Wt 80 lb (36.3 kg)   LMP 05/28/2019   BMI 17.31 kg/m   Physical Exam  Ortho Exam  Left ankle distal CMS intact. Swelling controlled compartments soft. Incision healed.  Specialty Comments:  No specialty comments available.  Imaging: No results found.   PMFS History: Patient Active Problem List   Diagnosis Date Noted  . Closed nondisplaced fracture of medial malleolus of left tibia   . 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 Contracture of muscle of left upper arm 05/27/2014  . UTI (urinary tract infection) 05/27/2014  . Dysarthria 05/25/2014  . Anxiety 05/25/2014  . 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 Muscle tone atonic 02/05/2014  . Multiple sclerosis ([place]) 12/22/2013  . Syncope and collapse 12/22/2013   Past Medical History:  Diagnosis Date  . Anxiety   . Asthma   . Bulging disc   . Depression   . Headache    05/29/2019- has had several a month, now  every other month  . History of transfusion    2003- pneumonia and pregnant  . Hypertension   . MS (multiple sclerosis) ([place])   . Pneumonia    last time 2003  . Stroke [facility])    "multiple strokes- left side weakness"  . Syncope     Family History  Problem Relation Age of Onset  . Hypertension Mother   . Diabetes Mellitus II Mother   . Heart failure Mother   . Other Father     Past Surgical History:  Procedure Laterality Date  . ORIF ANKLE FRACTURE Left 05/29/2019   Procedure: OPEN REDUCTION INTERNAL FIXATION (ORIF) LEFT ANKLE FRACTURE;  Surgeon: Newt Minion, MD;  Location: [facility];  Service: Orthopedics;  Laterality: Left;  . TUBAL LIGATION    . WISDOM TOOTH EXTRACTION     Social History   Occupational History  . Not on file  Tobacco Use  . Smoking status: Current Every Day Smoker    Packs/day: 0.30    Years: 19.00    Pack years: 5.70    Types: Cigarettes  . Smokeless tobacco: Never Used  Substance and Sexual Activity  . Alcohol use: No  . Drug use: No  . Sexual activity: Yes    Birth control/protection: Surgical

## 2019-06-13 ENCOUNTER — Telehealth: Payer: Self-pay

## 2019-06-13 NOTE — Telephone Encounter (Signed)
Pt called Dr. Sharol Given direct to ask for a refill on pain medication. She had received a rx on 06/04/19 #30 and so Dr. Sharol Given declined the pt's request. Pt voiced understanding and was advised to call the office or pharmacy for any future refills.

## 2019-06-18 ENCOUNTER — Telehealth: Payer: Self-pay | Admitting: Orthopedic Surgery

## 2019-06-18 NOTE — Telephone Encounter (Signed)
Pt called in requesting a refill on Oxycodone, please have that sent to [facility] on [address]

## 2019-06-18 NOTE — Telephone Encounter (Signed)
Too soon for refill.

## 2019-06-18 NOTE — Telephone Encounter (Signed)
Pt is s/p an ORIF left ankle fx 05/29/19 requesting refill on pain medication. Last refill Oxycodone 5/325 06/04/19 #30, 05/29/19 #30 , 05/27/19 #30

## 2019-06-19 NOTE — Telephone Encounter (Signed)
Patient was called and no answer. Please refer to notes pertaining to her medication if she calls back. Thank you.

## 2019-06-20 ENCOUNTER — Telehealth: Payer: Self-pay | Admitting: Orthopedic Surgery

## 2019-06-20 NOTE — Telephone Encounter (Signed)
Patient called advised the pharmacy need auth from Dr Sharol Given before they will fill the Rx for Oxycodone 5 mg.  Patient said she fell yesterday and is in a lot of pain. The number to contact patient is [phone]

## 2019-06-20 NOTE — Telephone Encounter (Signed)
Patient was called and informed that her pain med is too soon to refill and she was given option to come in for an appt but patient stated she will try and wait it out. Patient understood that if needed she can call back to come in for earlier appt

## 2019-06-27 ENCOUNTER — Ambulatory Visit (INDEPENDENT_AMBULATORY_CARE_PROVIDER_SITE_OTHER): Payer: Medicaid Other

## 2019-06-27 ENCOUNTER — Other Ambulatory Visit: Payer: Self-pay

## 2019-06-27 ENCOUNTER — Ambulatory Visit (INDEPENDENT_AMBULATORY_CARE_PROVIDER_SITE_OTHER): Payer: Medicaid Other | Admitting: Orthopedic Surgery

## 2019-06-27 ENCOUNTER — Ambulatory Visit: Payer: Self-pay

## 2019-06-27 DIAGNOSIS — M79672 Pain in left foot: Secondary | ICD-10-CM

## 2019-06-27 DIAGNOSIS — M79671 Pain in right foot: Secondary | ICD-10-CM

## 2019-06-27 DIAGNOSIS — S8255XA Nondisplaced fracture of medial malleolus of left tibia, initial encounter for closed fracture: Secondary | ICD-10-CM

## 2019-06-28 ENCOUNTER — Telehealth: Payer: Self-pay | Admitting: Orthopedic Surgery

## 2019-06-28 MED ORDER — OXYCODONE-ACETAMINOPHEN 5-325 MG PO TABS: 1.0000 | ORAL_TABLET | Freq: Four times a day (QID) | ORAL | 0 refills | Status: DC | PRN

## 2019-06-28 NOTE — Telephone Encounter (Signed)
Erin please advise, thank you.  

## 2019-06-28 NOTE — Telephone Encounter (Signed)
Refill sent.

## 2019-06-28 NOTE — Telephone Encounter (Signed)
Patient called about refill Hydrocodone#5. Please call patient to advise.  [phone]

## 2019-07-08 ENCOUNTER — Encounter: Payer: Self-pay | Admitting: Orthopedic Surgery

## 2019-07-08 NOTE — Progress Notes (Signed)
Office Visit Note   Patient: [name]           Date of Birth: 06-Dec-1975           MRN: [id number] Visit Date: 06/27/2019              Requested by: Oval Linsey, MD [address] [place],  [place] [address] PCP: Oval Linsey, MD  Chief Complaint  Patient presents with  . Left Ankle - Routine Post Op    05/29/2019 ORIF Left ankle   . Left Foot - Routine Post Op      HPI: Patient is a 43 year old woman who presents for 3 separate problems.  She is status post open reduction internal fixation left ankle medial malleolus approximately 6 weeks ago.  She is currently weightbearing with crutches patient states she fell on her right leg last week twisting her ankle she states she has pain from the heel radiating up her calf.  Assessment & Plan: Visit Diagnoses:  1. Pain in both feet   2. Closed nondisplaced fracture of medial malleolus of left tibia, initial encounter     Plan: Patient will continue to increase her activities as tolerated no restrictions at this time.  Follow-Up Instructions: Return in about 4 weeks (around 07/25/2019).   Ortho Exam  Patient is alert, oriented, no adenopathy, well-dressed, normal affect, normal respiratory effort. Examination of the right calcaneus there is no tenderness to palpation the posterior facet is congruent.  Patient has bruising on both sides of the right ankle.  Radiograph shows no fractures she is tender to palpation over the lateral and medial ankle ligaments.  There is no instability negative anterior drawer.  Left ankle has good range of motion passively.  Imaging: No results found. No images are attached to the encounter.  Labs: Lab Results  Component Value Date   REPTSTATUS 06/05/2014 FINAL 06/03/2014   CULT  06/03/2014    Multiple bacterial morphotypes present, none predominant. Suggest appropriate recollection if clinically indicated. Performed at [facility]  11/17/2013     Lab Results  Component Value Date   ALBUMIN 3.8 12/26/2013   ALBUMIN 3.7 01/19/2010   ALBUMIN 3.5 08/27/2008    No results found for: MG No results found for: VD25OH  No results found for: PREALBUMIN CBC EXTENDED Latest Ref Rng & Units 05/29/2019 06/03/2014 05/27/2014  WBC 4.0 - 10.5 K/uL 9.0 12.9(H) 6.0  RBC 3.87 - 5.11 MIL/uL 4.27 5.04 3.55(L)  HGB 12.0 - 15.0 g/dL 65.6 15.4(H) 10.6(L)  HCT 36.0 - 46.0 % 41.4 43.4 31.0(L)  PLT 150 - 400 K/uL 433(H) 268 209  NEUTROABS 1.7 - 7.7 K/uL - 7.4 -  LYMPHSABS 0.7 - 4.0 K/uL - 3.8 -     There is no height or weight on file to calculate BMI.  Orders:  Orders Placed This Encounter  Procedures  . XR Ankle Complete Left  . XR Os Calcis Left  . XR Ankle Complete Right   No orders of the defined types were placed in this encounter.    Procedures: No procedures performed  Clinical Data: No additional findings.  ROS:  All other systems negative, except as noted in the HPI. Review of Systems  Objective: Vital Signs: LMP 05/28/2019   Specialty Comments:  No specialty comments available.  PMFS History: Patient Active Problem List   Diagnosis Date Noted  . Closed nondisplaced fracture of medial malleolus of left tibia   . Contracture  of muscle of left lower leg 05/27/2014  . Contracture of muscle of left upper arm 05/27/2014  . UTI (urinary tract infection) 05/27/2014  . Dysarthria 05/25/2014  . Anxiety 05/25/2014  . 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 Muscle tone atonic 02/05/2014  . Multiple sclerosis ([place]) 12/22/2013  . Syncope and collapse 12/22/2013   Past Medical History:  Diagnosis Date  . Anxiety   . Asthma   . Bulging disc   . Depression   . Headache    05/29/2019- has had several a month, now every other month  . History of transfusion    2003- pneumonia and pregnant  . Hypertension   . MS (multiple sclerosis) ([place])   . Pneumonia    last time 2003  . Stroke [facility])    "multiple  strokes- left side weakness"  . Syncope     Family History  Problem Relation Age of Onset  . Hypertension Mother   . Diabetes Mellitus II Mother   . Heart failure Mother   . Other Father     Past Surgical History:  Procedure Laterality Date  . ORIF ANKLE FRACTURE Left 05/29/2019   Procedure: OPEN REDUCTION INTERNAL FIXATION (ORIF) LEFT ANKLE FRACTURE;  Surgeon: Newt Minion, MD;  Location: [place];  Service: Orthopedics;  Laterality: Left;  . TUBAL LIGATION    . WISDOM TOOTH EXTRACTION     Social History   Occupational History  . Not on file  Tobacco Use  . Smoking status: Current Every Day Smoker    Packs/day: 0.30    Years: 19.00    Pack years: 5.70    Types: Cigarettes  . Smokeless tobacco: Never Used  Substance and Sexual Activity  . Alcohol use: No  . Drug use: No  . Sexual activity: Yes    Birth control/protection: Surgical

## 2019-07-17 ENCOUNTER — Telehealth: Payer: Self-pay | Admitting: Orthopedic Surgery

## 2019-07-17 ENCOUNTER — Other Ambulatory Visit: Payer: Self-pay | Admitting: Orthopedic Surgery

## 2019-07-17 MED ORDER — OXYCODONE-ACETAMINOPHEN 5-325 MG PO TABS: 1.0000 | ORAL_TABLET | Freq: Four times a day (QID) | ORAL | 0 refills | Status: DC | PRN

## 2019-07-17 NOTE — Telephone Encounter (Signed)
Patient called to request an RX refill on her Oxycodone 5mg .  Patient uses [facility] in [place].  CB#[phone].  Thank you.

## 2019-07-17 NOTE — Telephone Encounter (Signed)
05/29/19 ORIF left ankle. Pt calling and wants to have a refill on pain medication. Last refill was 06/28/19 Oxycodone 5/325 #30 please advise.

## 2019-07-17 NOTE — Telephone Encounter (Signed)
rx sent

## 2019-07-25 ENCOUNTER — Other Ambulatory Visit: Payer: Self-pay

## 2019-07-25 ENCOUNTER — Encounter: Payer: Self-pay | Admitting: Orthopedic Surgery

## 2019-07-25 ENCOUNTER — Ambulatory Visit (INDEPENDENT_AMBULATORY_CARE_PROVIDER_SITE_OTHER): Payer: Medicaid Other | Admitting: Orthopedic Surgery

## 2019-07-25 VITALS — Ht <= 58 in | Wt 80.0 lb

## 2019-07-25 DIAGNOSIS — S8255XA Nondisplaced fracture of medial malleolus of left tibia, initial encounter for closed fracture: Secondary | ICD-10-CM

## 2019-07-25 MED ORDER — OXYCODONE-ACETAMINOPHEN 5-325 MG PO TABS: 1.0000 | ORAL_TABLET | Freq: Four times a day (QID) | ORAL | 0 refills | Status: DC | PRN

## 2019-07-26 ENCOUNTER — Encounter: Payer: Self-pay | Admitting: Orthopedic Surgery

## 2019-07-26 NOTE — Progress Notes (Signed)
Office Visit Note   Patient: [name]           Date of Birth: Feb 05, 1976           MRN: [id number] Visit Date: 07/25/2019              Requested by: Lucia Gaskins, MD [address] [place],   [id number] PCP: Lucia Gaskins, MD  Chief Complaint  Patient presents with  . Left Ankle - Routine Post Op    05/29/2019 ORIF Left ankle         HPI: This is a pleasant woman who is now 6 weeks status post ORIF of a left ankle fracture and a nonoperative calcaneus fracture.  She is currently weightbearing in regular shoes.  She has no pain on the left side and still continues to have some pain on the right.  She is trying to take care of her self however her daughter recently passed away and she is caring for her 2 grandchildren  Assessment & Plan: Visit Diagnoses: Right calcaneus fracture left ankle fracture  Plan: I had a discussion with her with regards to her calcaneus fracture.  She has made good progress and has good motion but she understands there is a strong possibility she may develop some subtalar arthritis she may follow-up in 2 months and at that time x-rays of her calcaneus should be taken  Follow-Up Instructions: No follow-ups on file.   Ortho Exam  Patient is alert, oriented, no adenopathy, well-dressed, normal affect, normal respiratory effort. Right foot: Mild soft tissue swelling distal CMS is intact no pain with ankle range of motion which is quite good she does have pain with subtalar motion and some stiffness Left ankle: Distal CMS intact no soft tissue swelling ankle range of motion is good and painless  Imaging: No results found. No images are attached to the encounter.  Labs: Lab Results  Component Value Date   REPTSTATUS 06/05/2014 FINAL 06/03/2014   CULT  06/03/2014    Multiple bacterial morphotypes present, none predominant. Suggest appropriate recollection if clinically indicated. Performed at [facility] 11/17/2013     Lab Results  Component Value Date   ALBUMIN 3.8 12/26/2013   ALBUMIN 3.7 01/19/2010   ALBUMIN 3.5 08/27/2008    No results found for: MG No results found for: VD25OH  No results found for: PREALBUMIN CBC EXTENDED Latest Ref Rng & Units 05/29/2019 06/03/2014 05/27/2014  WBC 4.0 - 10.5 K/uL 9.0 12.9(H) 6.0  RBC 3.87 - 5.11 MIL/uL 4.27 5.04 3.55(L)  HGB 12.0 - 15.0 g/dL 13.1 15.4(H) 10.6(L)  HCT 36.0 - 46.0 % 41.4 43.4 31.0(L)  PLT 150 - 400 K/uL 433(H) 268 209  NEUTROABS 1.7 - 7.7 K/uL - 7.4 -  LYMPHSABS 0.7 - 4.0 K/uL - 3.8 -     Body mass index is 17.31 kg/m.  Orders:  No orders of the defined types were placed in this encounter.  Meds ordered this encounter  Medications  . oxyCODONE-acetaminophen (PERCOCET/ROXICET) 5-325 MG tablet    Sig: Take 1 tablet by mouth every 6 (six) hours as needed.    Dispense:  30 tablet    Refill:  0     Procedures: No procedures performed  Clinical Data: No additional findings.  ROS:  All other systems negative, except as noted in the HPI. Review of Systems  Objective: Vital Signs: Ht 4\' 9"  (1.448 m)   Wt 80 lb (36.3 kg)  BMI 17.31 kg/m   Specialty Comments:  No specialty comments available.  PMFS History: Patient Active Problem List   Diagnosis Date Noted  . Closed nondisplaced fracture of medial malleolus of left tibia   . 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 Contracture of muscle of left upper arm 05/27/2014  . UTI (urinary tract infection) 05/27/2014  . Dysarthria 05/25/2014  . Anxiety 05/25/2014  . 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 Muscle tone atonic 02/05/2014  . Multiple sclerosis (HCC) 12/22/2013  . Syncope and collapse 12/22/2013   Past Medical History:  Diagnosis Date  . Anxiety   . Asthma   . Bulging disc   . Depression   . Headache    05/29/2019- has had several a month, now every other month  . History of transfusion    2003- pneumonia and  pregnant  . Hypertension   . MS (multiple sclerosis) (HCC)   . Pneumonia    last time 2003  . Stroke [facility])    "multiple strokes- left side weakness"  . Syncope     Family History  Problem Relation Age of Onset  . Hypertension Mother   . Diabetes Mellitus II Mother   . Heart failure Mother   . Other Father     Past Surgical History:  Procedure Laterality Date  . ORIF ANKLE FRACTURE Left 05/29/2019   Procedure: OPEN REDUCTION INTERNAL FIXATION (ORIF) LEFT ANKLE FRACTURE;  Surgeon: Nadara Mustard, MD;  Location: [facility] OR;  Service: Orthopedics;  Laterality: Left;  . TUBAL LIGATION    . WISDOM TOOTH EXTRACTION     Social History   Occupational History  . Not on file  Tobacco Use  . Smoking status: Current Every Day Smoker    Packs/day: 0.30    Years: 19.00    Pack years: 5.70    Types: Cigarettes  . Smokeless tobacco: Never Used  Substance and Sexual Activity  . Alcohol use: No  . Drug use: No  . Sexual activity: Yes    Birth control/protection: Surgical

## 2019-08-05 ENCOUNTER — Telehealth: Payer: Self-pay | Admitting: Orthopedic Surgery

## 2019-08-05 ENCOUNTER — Other Ambulatory Visit: Payer: Self-pay | Admitting: Physician Assistant

## 2019-08-05 MED ORDER — OXYCODONE-ACETAMINOPHEN 5-325 MG PO TABS: 1.0000 | ORAL_TABLET | Freq: Three times a day (TID) | ORAL | 0 refills | Status: DC | PRN

## 2019-08-05 NOTE — Telephone Encounter (Signed)
I refilled her pain med and left her a message. Not sure what she wants for "nerves"

## 2019-08-05 NOTE — Telephone Encounter (Signed)
Patient called requesting an RX be sent in for pain in her right ankle and leg.  She is also requesting something for her nerves.  CB#[phone].  Thank you.

## 2019-08-05 NOTE — Telephone Encounter (Signed)
Please advise, thank you.

## 2019-08-05 NOTE — Telephone Encounter (Signed)
Ok, thank you. Noted 

## 2019-08-21 ENCOUNTER — Telehealth: Payer: Self-pay | Admitting: Orthopedic Surgery

## 2019-08-21 ENCOUNTER — Other Ambulatory Visit: Payer: Self-pay | Admitting: Physician Assistant

## 2019-08-21 MED ORDER — OXYCODONE-ACETAMINOPHEN 5-325 MG PO TABS: 1.0000 | ORAL_TABLET | Freq: Three times a day (TID) | ORAL | 0 refills | Status: DC | PRN

## 2019-08-21 NOTE — Telephone Encounter (Signed)
Patient was called. Understands this will be her last refill.

## 2019-08-21 NOTE — Telephone Encounter (Signed)
Please advise, thank you.

## 2019-08-21 NOTE — Telephone Encounter (Signed)
Patient called  She needs a refill on her Oxycodone.   Call back number: [phone]

## 2019-09-26 ENCOUNTER — Ambulatory Visit: Payer: Medicaid Other | Admitting: Orthopedic Surgery

## 2020-05-21 ENCOUNTER — Emergency Department (HOSPITAL_COMMUNITY)
Admission: EM | Admit: 2020-05-21 | Discharge: 2020-05-21 | Disposition: A | Discharge status: Home / Self Care | Payer: Medicaid Other | Attending: Emergency Medicine | Admitting: Emergency Medicine

## 2020-05-21 ENCOUNTER — Other Ambulatory Visit: Payer: Self-pay

## 2020-05-21 ENCOUNTER — Encounter (HOSPITAL_COMMUNITY): Payer: Self-pay | Admitting: *Deleted

## 2020-05-21 DIAGNOSIS — F17210 Nicotine dependence, cigarettes, uncomplicated: Secondary | ICD-10-CM | POA: Diagnosis not present

## 2020-05-21 DIAGNOSIS — L0291 Cutaneous abscess, unspecified: Secondary | ICD-10-CM

## 2020-05-21 DIAGNOSIS — Z79899 Other long term (current) drug therapy: Secondary | ICD-10-CM | POA: Diagnosis not present

## 2020-05-21 DIAGNOSIS — Z7982 Long term (current) use of aspirin: Secondary | ICD-10-CM | POA: Insufficient documentation

## 2020-05-21 DIAGNOSIS — J45909 Unspecified asthma, uncomplicated: Secondary | ICD-10-CM | POA: Insufficient documentation

## 2020-05-21 DIAGNOSIS — L02412 Cutaneous abscess of left axilla: Secondary | ICD-10-CM | POA: Insufficient documentation

## 2020-05-21 DIAGNOSIS — L02413 Cutaneous abscess of right upper limb: Secondary | ICD-10-CM | POA: Insufficient documentation

## 2020-05-21 DIAGNOSIS — I10 Essential (primary) hypertension: Secondary | ICD-10-CM | POA: Diagnosis not present

## 2020-05-21 MED ORDER — OXYCODONE-ACETAMINOPHEN 5-325 MG PO TABS
2.0000 | ORAL_TABLET | Freq: Once | ORAL | Status: AC
  Administered 2020-05-21: 2 via ORAL
  Filled 2020-05-21: qty 2

## 2020-05-21 MED ORDER — DOXYCYCLINE HYCLATE 100 MG PO CAPS: 100.0000 mg | ORAL_CAPSULE | Freq: Two times a day (BID) | ORAL | 0 refills | Status: AC

## 2020-05-21 MED ORDER — LIDOCAINE-EPINEPHRINE (PF) 2 %-1:200000 IJ SOLN
20.0000 mL | Freq: Once | INTRAMUSCULAR | Status: DC
  Filled 2020-05-21: qty 20

## 2020-05-21 NOTE — Discharge Instructions (Addendum)
You were given a prescription for antibiotics. Please take the antibiotic prescription fully.   Please follow up with your primary care provider within 5-7 days for re-evaluation of your symptoms. If you do not have a primary care provider, information for a healthcare clinic has been provided for you to make arrangements for follow up care.  Please return to the emergency room immediately if you experience any new or worsening symptoms or any symptoms that indicate worsening infection such as fevers, increased redness/swelling/pain, warmth, or drainage from the affected area.    

## 2020-05-21 NOTE — ED Triage Notes (Signed)
Pt c/o boil to left axilla and right forearm x 4 days. Denies any drainage, fever.

## 2020-05-21 NOTE — ED Provider Notes (Signed)
[facility] EMERGENCY DEPARTMENT Provider Note   CSN: [id number] Arrival date & time: 05/21/20  1221     History Chief Complaint  Patient presents with   Recurrent Skin Infections    [name] is a 44 y.o. female.  HPI   44 year old female presenting to emergency department today for evaluation of multiple abscesses.  States she has an abscess to the right forearm and the left axilla.  Been present for the last 4 days.  She has not had any drainage from the wounds.  She is had no fevers or systemic symptoms. 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     Family History  Problem Relation Age of Onset   Hypertension Mother    Diabetes Mellitus II Mother    Heart failure Mother    Other  Father     Social History   Tobacco Use   Smoking status: Current Every Day Smoker    Packs/day: 0.30    Years: 19.00    Pack years: 5.70    Types: Cigarettes   Smokeless tobacco: Never Used  Building services engineer Use: Never used  Substance Use Topics   Alcohol use: No   Drug use: No    Home Medications Prior to Admission medications   Medication Sig Start Date End Date Taking? Authorizing Provider  acetaminophen (TYLENOL) 500 MG tablet Take 500 mg by mouth every 6 (six) hours as needed for headache.    [provider]  albuterol (PROVENTIL HFA;VENTOLIN HFA) 108 (90 BASE) MCG/ACT inhaler Inhale 2 puffs into the lungs every 6 (six) hours as needed (asthma). Asthma.    [provider]  albuterol (PROVENTIL) (2.5 MG/3ML) 0.083% nebulizer solution Take 2.5 mg by nebulization every 6 (six) hours as needed (asthma). Asthma.    [provider]  amLODipine (NORVASC) 5 MG tablet Take 1 tablet (5 mg total) by mouth daily. 05/29/19   Nadara Mustard, MD  aspirin 81 MG chewable tablet Chew 81 mg by mouth daily.    [provider]  doxycycline (VIBRAMYCIN) 100 MG capsule Take 1 capsule (100 mg  total) by mouth 2 (two) times daily for 7 days. 05/21/20 05/28/20  Mattalynn Crandle S, PA-C  metoprolol tartrate (LOPRESSOR) 25 MG tablet Take 1 tablet (25 mg total) by mouth 2 (two) times daily. 05/29/19   Nadara Mustard, MD  Misc. Devices [facility]) MISC Dispense one wheelchair 05/18/19   Devoria Albe, MD  Multiple Vitamins-Minerals (CENTRUM ULTRA WOMENS) TABS Take 1 tablet by mouth daily.    [provider]  oxyCODONE-acetaminophen (PERCOCET/ROXICET) 5-325 MG tablet Take 1 tablet by mouth every 8 (eight) hours as needed. 08/21/19   Persons, [place], PA    Allergies    Naproxen, Penicillins, Codeine, and Sulfa antibiotics  Review of Systems   Review of Systems  Constitutional: Negative for fever.  Musculoskeletal:       Pain to right arm and left  axilla  Skin:       abscess  Neurological: Negative for weakness and numbness.    Physical Exam Updated Vital Signs BP (!) 145/97 (BP Location: Right Arm)    Pulse 96    Temp 97.7 F (36.5 C) (Oral)    Resp 16    Ht 4\' 9"  (1.448 m)    Wt 37.2 kg    LMP 05/14/2020    SpO2 100%    BMI 17.74 kg/m   Physical Exam Vitals and nursing note reviewed.  Constitutional:      General: She is not in acute distress.    Appearance: She is well-developed.  HENT:     Head: Normocephalic and atraumatic.  Eyes:     Conjunctiva/sclera: Conjunctivae normal.  Cardiovascular:     Rate and Rhythm: Normal rate.  Pulmonary:     Effort: Pulmonary effort is normal.  Musculoskeletal:        General: Normal range of motion.     Cervical back: Neck supple.     Comments: 2cm area of erythema to the dorsal right forearm with central area of fluctuance.  1.5 cm area of fluctuance noted to the left axilla with erythema and tenderness to palpation.  Skin:    General: Skin is warm and dry.  Neurological:     Mental Status: She is alert.     ED Results / Procedures / Treatments   Labs (all labs ordered are listed, but only abnormal results are displayed) Labs Reviewed - No data to display  EKG None  Radiology No results found.  Procedures .12/07/2021Incision and Drainage  Date/Time: 05/21/2020 4:10 PM Performed by: 05/23/2020, PA-C Authorized by: Karrie Meres, PA-C   Consent:    Consent obtained:  Verbal   Consent given by:  Patient   Risks discussed:  Bleeding, incomplete drainage, pain and damage to other organs   Alternatives discussed:  No treatment Universal protocol:    Procedure explained and questions answered to patient or proxy's satisfaction: yes     Relevant documents present and verified: yes     Test results available and properly labeled: yes     Imaging studies available: yes     Required blood products, implants, devices, and special equipment available: yes      Site/side marked: yes     Immediately prior to procedure a time out was called: yes     Patient identity confirmed:  Verbally with patient Location:    Type:  Abscess   Size:  2cm   Location:  Upper extremity   Upper extremity location:  Arm   Arm location:  R lower arm Pre-procedure details:  Skin preparation:  Chloraprep Anesthesia (see MAR for exact dosages):    Anesthesia method:  Local infiltration   Local anesthetic:  Lidocaine 2% WITH epi Procedure type:    Complexity:  Complex Procedure details:    Incision types:  Single straight   Incision depth:  Subcutaneous   Scalpel blade:  11   Drainage:  Purulent   Drainage amount:  Scant Post-procedure details:    Patient tolerance of procedure:  Tolerated well, no immediate complications  .Marland KitchenIncision and Drainage  Date/Time: 05/21/2020 4:16 PM Performed by: Karrie Meres, PA-C Authorized by: Karrie Meres, PA-C   Consent:    Consent obtained:  Verbal   Consent given by:  Patient   Risks discussed:  Bleeding, incomplete drainage, pain and damage to other organs   Alternatives discussed:  No treatment Universal protocol:    Procedure explained and questions answered to patient or proxy's satisfaction: yes     Relevant documents present and verified: yes     Test results available and properly labeled: yes     Imaging studies available: yes     Required blood products, implants, devices, and special equipment available: yes     Site/side marked: yes     Immediately prior to procedure a time out was called: yes     Patient identity confirmed:  Verbally with patient Location:    Type:  Abscess   Location: left axilla. Pre-procedure details:    Skin preparation:  Betadine Anesthesia (see MAR for exact dosages):    Anesthesia method:  Local infiltration   Local anesthetic:  Lidocaine 2% WITH epi Procedure type:    Complexity:  Simple Procedure details:    Incision types:  Single straight   Incision depth:   Subcutaneous   Scalpel blade:  11   Drainage:  Purulent   Drainage amount:  Scant Post-procedure details:    Patient tolerance of procedure:  Tolerated well, no immediate complications   (including critical care time)  Medications Ordered in ED Medications  lidocaine-EPINEPHrine (XYLOCAINE W/EPI) 2 %-1:200000 (PF) injection 20 mL (has no administration in time range)  oxyCODONE-acetaminophen (PERCOCET/ROXICET) 5-325 MG per tablet 2 tablet (2 tablets Oral Given 05/21/20 1500)    ED Course  I have reviewed the triage vital signs and the nursing notes.  Pertinent labs & imaging results that were available during my care of the patient were reviewed by me and considered in my medical decision making (see chart for details).    MDM Rules/Calculators/A&P                          Patient with skin abscesses amenable to incision and drainage.  Abscess was not large enough to warrant packing or drain. Encouraged home warm soaks and flushing.  Mild signs of cellulitis is surrounding skin.  Will d/c to home with abx given multiple abscesses.   Final Clinical Impression(s) / ED Diagnoses Final diagnoses:  Abscess    Rx / DC Orders ED Discharge Orders         Ordered    doxycycline (VIBRAMYCIN) 100 MG capsule  2 times daily        05/21/20 [address] Bradden Tadros S, PA-C 05/21/20 1617    LongArlyss Repress, MD 05/25/20 1646

## 2020-05-22 ENCOUNTER — Telehealth: Payer: Self-pay

## 2020-05-22 NOTE — Telephone Encounter (Signed)
Transition Care Management Unsuccessful Follow-up Telephone Call  Date of discharge and from where:  [name] 05[facility]/2020  Attempts:  1st Attempt  Reason for unsuccessful TCM follow-up call:  Left voice message

## 2020-06-27 ENCOUNTER — Encounter (HOSPITAL_COMMUNITY): Payer: Self-pay | Admitting: *Deleted

## 2020-06-27 ENCOUNTER — Other Ambulatory Visit: Payer: Self-pay

## 2020-06-27 ENCOUNTER — Emergency Department (HOSPITAL_COMMUNITY): Payer: Medicaid Other

## 2020-06-27 ENCOUNTER — Emergency Department (HOSPITAL_COMMUNITY)
Admission: EM | Admit: 2020-06-27 | Discharge: 2020-06-27 | Disposition: A | Discharge status: Home / Self Care | Payer: Medicaid Other | Attending: Emergency Medicine | Admitting: Emergency Medicine

## 2020-06-27 DIAGNOSIS — Z7982 Long term (current) use of aspirin: Secondary | ICD-10-CM | POA: Insufficient documentation

## 2020-06-27 DIAGNOSIS — X58XXXD Exposure to other specified factors, subsequent encounter: Secondary | ICD-10-CM | POA: Diagnosis not present

## 2020-06-27 DIAGNOSIS — S81802D Unspecified open wound, left lower leg, subsequent encounter: Secondary | ICD-10-CM | POA: Diagnosis present

## 2020-06-27 DIAGNOSIS — Z79899 Other long term (current) drug therapy: Secondary | ICD-10-CM | POA: Insufficient documentation

## 2020-06-27 DIAGNOSIS — F17210 Nicotine dependence, cigarettes, uncomplicated: Secondary | ICD-10-CM | POA: Diagnosis not present

## 2020-06-27 DIAGNOSIS — I10 Essential (primary) hypertension: Secondary | ICD-10-CM | POA: Diagnosis not present

## 2020-06-27 DIAGNOSIS — S81802A Unspecified open wound, left lower leg, initial encounter: Secondary | ICD-10-CM

## 2020-06-27 DIAGNOSIS — Z760 Encounter for issue of repeat prescription: Secondary | ICD-10-CM | POA: Diagnosis not present

## 2020-06-27 DIAGNOSIS — R509 Fever, unspecified: Secondary | ICD-10-CM | POA: Diagnosis not present

## 2020-06-27 DIAGNOSIS — Z23 Encounter for immunization: Secondary | ICD-10-CM | POA: Insufficient documentation

## 2020-06-27 DIAGNOSIS — J45909 Unspecified asthma, uncomplicated: Secondary | ICD-10-CM | POA: Diagnosis not present

## 2020-06-27 LAB — COMPREHENSIVE METABOLIC PANEL
ALT: 11 U/L (ref 0–44)
AST: 16 U/L (ref 15–41)
Albumin: 3.7 g/dL (ref 3.5–5.0)
Alkaline Phosphatase: 60 U/L (ref 38–126)
Anion gap: 11 (ref 5–15)
BUN: 6 mg/dL (ref 6–20)
CO2: 24 mmol/L (ref 22–32)
Calcium: 9.4 mg/dL (ref 8.9–10.3)
Chloride: 104 mmol/L (ref 98–111)
Creatinine, Ser: 0.86 mg/dL (ref 0.44–1.00)
GFR, Estimated: >60 mL/min (ref >60)
Glucose, Bld: 89 mg/dL (ref 70–99)
Potassium: 3.7 mmol/L (ref 3.5–5.1)
Sodium: 139 mmol/L (ref 135–145)
Total Bilirubin: 0.7 mg/dL (ref 0.3–1.2)
Total Protein: 6.9 g/dL (ref 6.5–8.1)

## 2020-06-27 LAB — CBC WITH DIFFERENTIAL/PLATELET
Abs Immature Granulocytes: 0.02 10*3/uL (ref 0.00–0.07)
Basophils Absolute: 0 10*3/uL (ref 0.0–0.1)
Basophils Relative: 1 %
Eosinophils Absolute: 0.3 10*3/uL (ref 0.0–0.5)
Eosinophils Relative: 4 %
HCT: 40.6 % (ref 36.0–46.0)
Hemoglobin: 13.2 g/dL (ref 12.0–15.0)
Immature Granulocytes: 0 %
Lymphocytes Relative: 34 %
Lymphs Abs: 2.7 10*3/uL (ref 0.7–4.0)
MCH: 30.6 pg (ref 26.0–34.0)
MCHC: 32.5 g/dL (ref 30.0–36.0)
MCV: 94.2 fL (ref 80.0–100.0)
Monocytes Absolute: 0.7 10*3/uL (ref 0.1–1.0)
Monocytes Relative: 9 %
Neutro Abs: 4.3 10*3/uL (ref 1.7–7.7)
Neutrophils Relative %: 52 %
Platelets: 312 10*3/uL (ref 150–400)
RBC: 4.31 MIL/uL (ref 3.87–5.11)
RDW: 12.8 % (ref 11.5–15.5)
WBC: 8.1 10*3/uL (ref 4.0–10.5)
nRBC: 0 % (ref 0.0–0.2)

## 2020-06-27 LAB — PROTIME-INR
INR: 1 (ref 0.8–1.2)
Prothrombin Time: 12.7 seconds (ref 11.4–15.2)

## 2020-06-27 LAB — LACTIC ACID, PLASMA: Lactic Acid, Venous: 1.3 mmol/L (ref 0.5–1.9)

## 2020-06-27 LAB — I-STAT BETA HCG BLOOD, ED (MC, WL, AP ONLY): I-stat hCG, quantitative: <5 m[IU]/mL (ref <5)

## 2020-06-27 MED ORDER — LIDOCAINE HCL (PF) 1 % IJ SOLN
5.0000 mL | Freq: Once | INTRAMUSCULAR | Status: AC
  Administered 2020-06-27: 5 mL
  Filled 2020-06-27: qty 5

## 2020-06-27 MED ORDER — ALBUTEROL SULFATE HFA 108 (90 BASE) MCG/ACT IN AERS: 2.0000 | INHALATION_SPRAY | Freq: Four times a day (QID) | RESPIRATORY_TRACT | 1 refills | Status: AC | PRN

## 2020-06-27 MED ORDER — METOPROLOL TARTRATE 25 MG PO TABS: 25.0000 mg | ORAL_TABLET | Freq: Two times a day (BID) | ORAL | 3 refills | Status: DC

## 2020-06-27 MED ORDER — DOXYCYCLINE HYCLATE 100 MG PO CAPS: 100.0000 mg | ORAL_CAPSULE | Freq: Two times a day (BID) | ORAL | 0 refills | Status: AC

## 2020-06-27 MED ORDER — FENTANYL CITRATE (PF) 100 MCG/2ML IJ SOLN
50.0000 ug | Freq: Once | INTRAMUSCULAR | Status: AC
  Administered 2020-06-27: 50 ug via INTRAMUSCULAR
  Filled 2020-06-27: qty 2

## 2020-06-27 MED ORDER — OXYCODONE-ACETAMINOPHEN 5-325 MG PO TABS
2.0000 | ORAL_TABLET | ORAL | 0 refills | Status: AC | PRN
Start: 2020-06-27 — End: 2020-06-30

## 2020-06-27 MED ORDER — ALBUTEROL SULFATE (2.5 MG/3ML) 0.083% IN NEBU: 2.5000 mg | INHALATION_SOLUTION | Freq: Four times a day (QID) | RESPIRATORY_TRACT | 1 refills | Status: AC | PRN

## 2020-06-27 MED ORDER — OXYCODONE-ACETAMINOPHEN 5-325 MG PO TABS
2.0000 | ORAL_TABLET | ORAL | 0 refills | Status: DC | PRN
Start: 2020-06-27 — End: 2020-06-27

## 2020-06-27 MED ORDER — TETANUS-DIPHTH-ACELL PERTUSSIS 5-2.5-18.5 LF-MCG/0.5 IM SUSY
0.5000 mL | PREFILLED_SYRINGE | Freq: Once | INTRAMUSCULAR | Status: AC
  Administered 2020-06-27: 0.5 mL via INTRAMUSCULAR
  Filled 2020-06-27: qty 0.5

## 2020-06-27 NOTE — ED Provider Notes (Addendum)
[facility] EMERGENCY DEPARTMENT Provider Note   CSN: [id number] Arrival date & time: 06/27/20  1640     History Chief Complaint  Patient presents with  . Wound Check    [name] is a 44 y.o. female.  HPI 44 year old female with a history of MS, depression, anxiety, asthma, hypertension, stroke presents to the ER with complaints of a wound to her left leg.  Patient states that she was helping her mother move on her rolled property when she noticed a wound to her left leg.  Unclear if she had hit it on a nail or potentially bitten by a bug.  The wound has progressively been getting worse and is more painful.  She notes some discharge and a low-grade fever of 100.6 today.  The wound is painful.  Denies any numbness or tingling. Also requesting medication refill of her albuterol and her hypertension medicine. No PCP at this time.     Past Medical History:  Diagnosis Date  . Anxiety   . Asthma   . Bulging disc   . Depression   . Headache    05/29/2019- has had several a month, now every other month  . History of transfusion    2003- pneumonia and pregnant  . Hypertension   . MS (multiple sclerosis) (HCC)   . Pneumonia    last time 2003  . Stroke [facility])    "multiple strokes- left side weakness"  . Syncope     Patient Active Problem List   Diagnosis Date Noted  . Closed nondisplaced fracture of medial malleolus of left tibia   . 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 Contracture of muscle of left upper arm 05/27/2014  . UTI (urinary tract infection) 05/27/2014  . Dysarthria 05/25/2014  . Anxiety 05/25/2014  . 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 Muscle tone atonic 02/05/2014  . Multiple sclerosis (HCC) 12/22/2013  . Syncope and collapse 12/22/2013    Past Surgical History:  Procedure Laterality Date  . ORIF ANKLE FRACTURE Left 05/29/2019   Procedure: OPEN REDUCTION INTERNAL FIXATION (ORIF) LEFT ANKLE FRACTURE;  Surgeon: Nadara Mustard, MD;  Location: [facility] OR;  Service: Orthopedics;  Laterality: Left;  . TUBAL LIGATION    . WISDOM TOOTH EXTRACTION       OB History   No obstetric history on file.     Family History  Problem Relation Age of Onset  . Hypertension Mother   . Diabetes Mellitus II Mother   . Heart failure Mother   . Other Father     Social History   Tobacco Use  . Smoking status: Current Every Day Smoker    Packs/day: 0.30    Years: 19.00    Pack years: 5.70    Types: Cigarettes  . Smokeless tobacco: Never Used  Vaping Use  . Vaping Use: Never used  Substance Use Topics  . Alcohol use: No  . Drug use: No    Home Medications Prior to Admission medications   Medication Sig Start Date End Date Taking? Authorizing Provider  acetaminophen (TYLENOL) 500 MG tablet Take 500 mg by mouth every 6 (six) hours as needed for headache.    [provider]  albuterol (PROVENTIL) (2.5 MG/3ML) 0.083% nebulizer solution Take 3 mLs (2.5 mg total) by nebulization every 6 (six) hours as needed (asthma). Asthma. 06/27/20   Mare Ferrari, PA-C  albuterol (VENTOLIN HFA) 108 (90 Base) MCG/ACT inhaler Inhale 2 puffs into the lungs every  6 (six) hours as needed (asthma). Asthma. 06/27/20   Mare Ferrari, PA-C  amLODipine (NORVASC) 5 MG tablet Take 1 tablet (5 mg total) by mouth daily. 05/29/19   Nadara Mustard, MD  aspirin 81 MG chewable tablet Chew 81 mg by mouth daily.    [provider]  doxycycline (VIBRAMYCIN) 100 MG capsule Take 1 capsule (100 mg total) by mouth 2 (two) times daily for 7 days. 06/27/20 07/04/20  Mare Ferrari, PA-C  metoprolol tartrate (LOPRESSOR) 25 MG tablet Take 1 tablet (25 mg total) by mouth 2 (two) times daily. 06/27/20   Mare Ferrari, PA-C  Misc. Devices [facility]) MISC Dispense one wheelchair 05/18/19   Devoria Albe, MD  Multiple Vitamins-Minerals (CENTRUM ULTRA WOMENS) TABS Take 1 tablet by mouth daily.    [provider]  oxyCODONE-acetaminophen  (PERCOCET/ROXICET) 5-325 MG tablet Take 2 tablets by mouth every 4 (four) hours as needed for up to 3 days for severe pain. 06/27/20 06/30/20  Mare Ferrari, PA-C    Allergies    Naproxen, Penicillins, Codeine, and Sulfa antibiotics  Review of Systems   Review of Systems  Constitutional: Positive for fever.  Skin: Positive for color change and wound.  Neurological: Negative for weakness and numbness.    Physical Exam Updated Vital Signs BP (!) 151/101   Pulse 84   Temp 98 F (36.7 C) (Oral)   Resp 14   Ht 4\' 11"  (1.499 m)   Wt 38.1 kg   LMP 05/31/2020   SpO2 100%   BMI 16.97 kg/m   Physical Exam Vitals and nursing note reviewed.  Constitutional:      General: She is not in acute distress.    Appearance: She is well-developed.  HENT:     Head: Normocephalic and atraumatic.  Eyes:     Conjunctiva/sclera: Conjunctivae normal.  Cardiovascular:     Rate and Rhythm: Normal rate and regular rhythm.     Heart sounds: No murmur heard.   Pulmonary:     Effort: Pulmonary effort is normal. No respiratory distress.     Breath sounds: Normal breath sounds.  Abdominal:     Palpations: Abdomen is soft.     Tenderness: There is no abdominal tenderness.  Musculoskeletal:        General: Tenderness present. No signs of injury. Normal range of motion.     Cervical back: Neck supple.     Right lower leg: No edema.     Left lower leg: No edema.  Skin:    General: Skin is warm and dry.     Findings: Erythema and lesion present.     Comments: Quarter size wound with central eschar with surrounding erythema on the medial aspect of the left shin. No notable fluctuance or pus drainage. Very tender.   Neurological:     Mental Status: She is alert and oriented to person, place, and time.  Psychiatric:        Mood and Affect: Mood normal.        Behavior: Behavior normal.       ED Results / Procedures / Treatments   Labs (all labs ordered are listed, but only abnormal results  are displayed) Labs Reviewed  CULTURE, BLOOD (ROUTINE X 2)  CULTURE, BLOOD (ROUTINE X 2)  COMPREHENSIVE METABOLIC PANEL  LACTIC ACID, PLASMA  CBC WITH DIFFERENTIAL/PLATELET  PROTIME-INR  LACTIC ACID, PLASMA  URINALYSIS, ROUTINE W REFLEX MICROSCOPIC  I-STAT BETA HCG BLOOD, ED ([facility], [facility], [facility] ONLY)  EKG None  Radiology DG Tibia/Fibula Left  Result Date: 06/27/2020 CLINICAL DATA:  Open wound to lower leg EXAM: LEFT TIBIA AND FIBULA - 2 VIEW COMPARISON:  05/18/2019 FINDINGS: Screw fixation of the medial malleolus. No acute displaced fracture or malalignment. No periostitis or bone destruction. No soft tissue emphysema IMPRESSION: Screw fixation of medial malleolus. No acute osseous abnormality. Electronically Signed   By: Jasmine Pang M.D.   On: 06/27/2020 19:20    Procedures .Marland KitchenIncision and Drainage  Date/Time: 06/27/2020 8:34 PM Performed by: Mare Ferrari, PA-C Authorized by: Mare Ferrari, PA-C   Consent:    Consent obtained:  Verbal   Consent given by:  Patient   Risks discussed:  Bleeding, incomplete drainage, pain and damage to other organs   Alternatives discussed:  No treatment Universal protocol:    Procedure explained and questions answered to patient or proxy's satisfaction: yes     Relevant documents present and verified: yes     Test results available and properly labeled: yes     Imaging studies available: yes     Required blood products, implants, devices, and special equipment available: yes     Site/side marked: yes     Immediately prior to procedure a time out was called: yes     Patient identity confirmed:  Verbally with patient Location:    Type:  Abscess   Location:  Lower extremity   Lower extremity location:  Leg   Leg location:  L lower leg Pre-procedure details:    Skin preparation:  Betadine Anesthesia (see MAR for exact dosages):    Anesthesia method:  Local infiltration   Local anesthetic:  Lidocaine 1% WITH epi Procedure type:     Complexity:  Simple Procedure details:    Incision types:  Single straight   Incision depth:  Subcutaneous   Scalpel blade:  11   Wound management:  Probed and deloculated, irrigated with saline and extensive cleaning   Drainage characteristics: none.   Drainage amount:  Moderate (none)   Wound treatment:  Wound left open   Packing materials:  None Post-procedure details:    Patient tolerance of procedure:  Tolerated well, no immediate complications   (including critical care time)  Medications Ordered in ED Medications  lidocaine (PF) (XYLOCAINE) 1 % injection 5 mL (5 mLs Infiltration Given 06/27/20 1955)  fentaNYL (SUBLIMAZE) injection 50 mcg (50 mcg Intramuscular Given 06/27/20 1957)  Tdap (BOOSTRIX) injection 0.5 mL (0.5 mLs Intramuscular Given 06/27/20 1956)    ED Course  I have reviewed the triage vital signs and the nursing notes.  Pertinent labs & imaging results that were available during my care of the patient were reviewed by me and considered in my medical decision making (see chart for details).    MDM Rules/Calculators/A&P                         44 year old female with wound to the left leg.  Unknown if this was a spider bite or possible injury from a nail.  On arrival, patient is afebrile, not tachycardic, hypotensive.  No acute cytosis, normal lactate.  No signs of sepsis. Blood cultures pending.  Lab work here without any abnormalities.  Plain films of the left tib-fib without evidence of osteomyelitis.  There was no significant fluctuance to the wound, however I did perform an incision and drainage to help open the wound for drainage and healing.  Will send home with short course of Percocet,  PDMP reviewed and appropriate.  Will send home with Doxy x7 days. Tetanus prophylacticly updated.  Patient does not have a PCP, requesting refills of home albuterol and blood pressure medications.  I directed her to the phone number in her discharge paperwork in order to  establish with a PCP.  Very strict return precautions discussed including worsening redness, swelling, drainage, worsening fevers, etc.  Patient was instructed to keep the area clean and dry and take the full course of antibiotics.  She voiced understanding and is agreeable.  At this stage in the ED course, the patient denies medically screened and stable for discharge.  Case discussed with Dr. Lynelle Doctor who is agreeable to the above plan and disposition.  Final Clinical Impression(s) / ED Diagnoses Final diagnoses:  Leg wound, left, initial encounter    Rx / DC Orders ED Discharge Orders         Ordered    albuterol (VENTOLIN HFA) 108 (90 Base) MCG/ACT inhaler  Every 6 hours PRN        06/27/20 1916    metoprolol tartrate (LOPRESSOR) 25 MG tablet  2 times daily        06/27/20 1916    albuterol (PROVENTIL) (2.5 MG/3ML) 0.083% nebulizer solution  Every 6 hours PRN        06/27/20 1916    doxycycline (VIBRAMYCIN) 100 MG capsule  2 times daily        06/27/20 2037    oxyCODONE-acetaminophen (PERCOCET/ROXICET) 5-325 MG tablet  Every 4 hours PRN,   Status:  Discontinued        06/27/20 2046    oxyCODONE-acetaminophen (PERCOCET/ROXICET) 5-325 MG tablet  Every 4 hours PRN        06/27/20 2047              Leone Brand 06/27/20 2106    Linwood Dibbles, MD 06/28/20 [id number]

## 2020-06-27 NOTE — ED Triage Notes (Signed)
The pt has an open wound approx size of a nickel she thinks she was bitten by a spider in an old house on Monday  The area is just below her lt knee red angry appearing open wound that is draining  The pt reports a low grade fever and pain from her leg up into her buttocks

## 2020-06-27 NOTE — Discharge Instructions (Addendum)
Please make sure to take the antibiotic as directed until finished completely.  Please keep the area clean and dry, you may clean the wound with soap and water as tolerated.  I will send in a few days of pain medicine for you. Do not take Tylenol with this medication. You may take Ibuprofren for pain as well. Please return to the ER for any signs of worsening infection, worsening redness, pain, fevers, chills.  Please call the phone number in your discharge paperwork in order to establish with a family doctor to manage your chronic health conditions

## 2020-06-29 ENCOUNTER — Telehealth: Payer: Self-pay

## 2020-06-29 NOTE — Telephone Encounter (Signed)
Transition Care Management Unsuccessful Follow-up Telephone Call  Date of discharge and from where:  06/27/2020 [facility] ED Attempts:  1st Attempt  Reason for unsuccessful TCM follow-up call:  Left voice message

## 2020-06-30 NOTE — Telephone Encounter (Signed)
Transition Care Management Follow-up Telephone Call  Date of discharge and from where: 06/27/[name]  How have you been since you were released from the hospital? Still sore and in pain.   Any questions or concerns? No  Items Reviewed:  Did the pt receive and understand the discharge instructions provided? Yes   Medications obtained and verified? No   Other? No   Any new allergies since your discharge? No   Dietary orders reviewed? Yes  Do you have support at home? Yes   Home Care and Equipment/Supplies: Were home health services ordered? not applicable If so, what is the name of the agency? na  Has the agency set up a time to come to the patient's home? na Were any new equipment or medical supplies ordered?  No What is the name of the medical supply agency? na Were you able to get the supplies/equipment? not applicable Do you have any questions related to the use of the equipment or supplies? No  Functional Questionnaire: (I = Independent and D = Dependent) ADLs: I  Bathing/Dressing- I  Meal Prep- I  Eating- I  Maintaining continence- I  Transferring/Ambulation- I  Managing Meds- I  Follow up appointments reviewed:   PCP Hospital f/u appt confirmed? Yes  Scheduled to see [facility] on 07/08/2020 @ 4PM.  Specialist [facility] f/u appt confirmed? No   Are transportation arrangements needed? No   If their condition worsens, is the pt aware to call PCP or go to the Emergency Dept.? Yes  Was the patient provided with contact information for the PCP's office or ED? Yes  Was to pt encouraged to call back with questions or concerns? Yes

## 2020-07-02 LAB — CULTURE, BLOOD (ROUTINE X 2)
Culture: NO GROWTH
Special Requests: ADEQUATE

## 2020-07-08 ENCOUNTER — Ambulatory Visit (INDEPENDENT_AMBULATORY_CARE_PROVIDER_SITE_OTHER): Payer: Medicaid Other | Admitting: Nurse Practitioner

## 2020-07-08 VITALS — BP 142/92 | HR 75 | Temp 97.7°F | Wt 88.0 lb

## 2020-07-08 DIAGNOSIS — J4520 Mild intermittent asthma, uncomplicated: Secondary | ICD-10-CM

## 2020-07-08 DIAGNOSIS — S81802A Unspecified open wound, left lower leg, initial encounter: Secondary | ICD-10-CM

## 2020-07-08 NOTE — Patient Instructions (Signed)
Wound Care, Adult Taking care of your wound properly can help to prevent pain, infection, and scarring. It can also help your wound to heal more quickly. How to care for your wound Wound care      Follow instructions from your health care provider about how to take care of your wound. Make sure you: ? Wash your hands with soap and water before you change the bandage (dressing). If soap and water are not available, use hand sanitizer. ? Change your dressing as told by your health care provider. ? Leave stitches (sutures), skin glue, or adhesive strips in place. These skin closures may need to stay in place for 2 weeks or longer. If adhesive strip edges start to loosen and curl up, you may trim the loose edges. Do not remove adhesive strips completely unless your health care provider tells you to do that.  Check your wound area every day for signs of infection. Check for: ? Redness, swelling, or pain. ? Fluid or blood. ? Warmth. ? Pus or a bad smell.  Ask your health care provider if you should clean the wound with mild soap and water. Doing this may include: ? Using a clean towel to pat the wound dry after cleaning it. Do not rub or scrub the wound. ? Applying a cream or ointment. Do this only as told by your health care provider. ? Covering the incision with a clean dressing.  Ask your health care provider when you can leave the wound uncovered.  Keep the dressing dry until your health care provider says it can be removed. Do not take baths, swim, use a hot tub, or do anything that would put the wound underwater until your health care provider approves. Ask your health care provider if you can take showers. You may only be allowed to take sponge baths. Medicines   If you were prescribed an antibiotic medicine, cream, or ointment, take or use the antibiotic as told by your health care provider. Do not stop taking or using the antibiotic even if your condition improves.  Take  over-the-counter and prescription medicines only as told by your health care provider. If you were prescribed pain medicine, take it 30 or more minutes before you do any wound care or as told by your health care provider. General instructions  Return to your normal activities as told by your health care provider. Ask your health care provider what activities are safe.  Do not scratch or pick at the wound.  Do not use any products that contain nicotine or tobacco, such as cigarettes and e-cigarettes. These may delay wound healing. If you need help quitting, ask your health care provider.  Keep all follow-up visits as told by your health care provider. This is important.  Eat a diet that includes protein, vitamin A, vitamin C, and other nutrient-rich foods to help the wound heal. ? Foods rich in protein include meat, dairy, beans, nuts, and other sources. ? Foods rich in vitamin A include carrots and dark green, leafy vegetables. ? Foods rich in vitamin C include citrus, tomatoes, and other fruits and vegetables. ? Nutrient-rich foods have protein, carbohydrates, fat, vitamins, or minerals. Eat a variety of healthy foods including vegetables, fruits, and whole grains. Contact a health care provider if:  You received a tetanus shot and you have swelling, severe pain, redness, or bleeding at the injection site.  Your pain is not controlled with medicine.  You have redness, swelling, or pain around the wound.    You have fluid or blood coming from the wound.  Your wound feels warm to the touch.  You have pus or a bad smell coming from the wound.  You have a fever or chills.  You are nauseous or you vomit.  You are dizzy. Get help right away if:  You have a red streak going away from your wound.  The edges of the wound open up and separate.  Your wound is bleeding, and the bleeding does not stop with gentle pressure.  You have a rash.  You faint.  You have trouble  breathing. Summary  Always wash your hands with soap and water before changing your bandage (dressing).  To help with healing, eat foods that are rich in protein, vitamin A, vitamin C, and other nutrients.  Check your wound every day for signs of infection. Contact your health care provider if you suspect that your wound is infected. This information is not intended to replace advice given to you by your health care provider. Make sure you discuss any questions you have with your health care provider. Document Revised: 11/12/2018 Document Reviewed: 02/09/2016 [employer] Patient Education  2020 [employer]  

## 2020-07-08 NOTE — Progress Notes (Signed)
@Patient  ID: , female    DOB: [birth year], 44 y.o.   MRN: [id number]  Chief Complaint  Patient presents with  . Transitions Of Care    ED follow up, wound on left left. Finished dose of antibiotic, still painful     Referring provider: [id number], MD   44 year old female with history of hypertension, asthma, MAS, depression, anxiety.  HPI  Patient presents today for ED follow-up visit.  Patient was seen in the ED on 06/27/2020.  She had I&D of wound to left leg.  Patient is unsure of how she got the wound.  She is unclear if she had hit it on a nail or potentially was bitten by a bug.  Wound cultures came back negative.  Patient was treated with antibiotic as well.  She has been doing antibacterial soap and warm water soaks to the area.  Her leg does appear to be well-healing but still has an open area to the center that is bleeding.  There is no purulent drainage noted.  There is no redness or inflammation around the open area.  We discussed that we will refer her to the wound clinic to ensure that the wound is close soon.  I suggested that she use Vaseline on the gauze that does not pull on her skin.  Patient states that she currently does not have a PCP because her PCP retired.  We will set her up an appointment to establish care with a new PCP during visit today.  Patient states that she does have a history of asthma and has the medication for her nebulizer but states that her nebulizer machine is broken.  She is requesting a prescription for new nebulizer machine today.  Denies f/c/s, n/v/d, hemoptysis, PND, chest pain or edema.       Allergies  Allergen Reactions  . Naproxen Shortness Of Breath  . Penicillins Anaphylaxis and Swelling    Did it involve swelling of the face/tongue/throat, SOB, or low BP? Yes Did it involve sudden or severe rash/hives, skin peeling, or any reaction on the inside of your mouth or nose? No Did you need to seek medical attention at  a hospital or doctor's office? Yes When did it last happen?in her 12s If all above answers are "NO", may proceed with cephalosporin use.   . Codeine Hives  . Sulfa Antibiotics Itching    Immunization History  Administered Date(s) Administered  . Pneumococcal Polysaccharide-23 12/24/2013  . Tdap 05/18/2019, 06/27/2020    Past Medical History:  Diagnosis Date  . Anxiety   . Asthma   . Bulging disc   . Depression   . Headache    05/29/2019- has had several a month, now every other month  . History of transfusion    2003- pneumonia and pregnant  . Hypertension   . MS (multiple sclerosis) (HCC)   . Pneumonia    last time 2003  . Stroke [facility])    "multiple strokes- left side weakness"  . Syncope     Tobacco History: Social History   Tobacco Use  Smoking Status Current Every Day Smoker  . Packs/day: 0.30  . Years: 19.00  . Pack years: 5.70  . Types: Cigarettes  Smokeless Tobacco Never Used   Ready to quit: Not Answered Counseling given: Not Answered   Outpatient Encounter Medications as of 07/08/2020  Medication Sig  . acetaminophen (TYLENOL) 500 MG tablet Take 500 mg by mouth every 6 (six) hours as needed for  headache.  . albuterol (PROVENTIL) (2.5 MG/3ML) 0.083% nebulizer solution Take 3 mLs (2.5 mg total) by nebulization every 6 (six) hours as needed (asthma). Asthma.  Marland Kitchen albuterol (VENTOLIN HFA) 108 (90 Base) MCG/ACT inhaler Inhale 2 puffs into the lungs every 6 (six) hours as needed (asthma). Asthma.  Marland Kitchen amLODipine (NORVASC) 5 MG tablet Take 1 tablet (5 mg total) by mouth daily.  Marland Kitchen aspirin 81 MG chewable tablet Chew 81 mg by mouth daily.  . metoprolol tartrate (LOPRESSOR) 25 MG tablet Take 1 tablet (25 mg total) by mouth 2 (two) times daily.  . Misc. Devices [facility]) MISC Dispense one wheelchair  . Multiple Vitamins-Minerals (CENTRUM ULTRA WOMENS) TABS Take 1 tablet by mouth daily.   No facility-administered encounter medications on file as of 07/08/2020.      Review of Systems  Review of Systems  Constitutional: Negative.   HENT: Negative.   Cardiovascular: Negative.   Gastrointestinal: Negative.   Skin: Positive for wound (left inner leg - healing).  Allergic/Immunologic: Negative.   Neurological: Negative.   Psychiatric/Behavioral: Negative.        Physical Exam  BP (!) 142/92 (BP Location: Left Arm)   Pulse 75   Temp 97.7 F (36.5 C)   Wt 88 lb 0.1 oz (39.9 kg)   LMP 07/03/2020   SpO2 97%   BMI 17.78 kg/m   Wt Readings from Last 5 Encounters:  07/08/20 88 lb 0.1 oz (39.9 kg)  06/27/20 84 lb (38.1 kg)  05/21/20 82 lb (37.2 kg)  07/25/19 80 lb (36.3 kg)  06/06/19 80 lb (36.3 kg)     Physical Exam Vitals and nursing note reviewed.  Constitutional:      General: She is not in acute distress.    Appearance: She is well-developed.  Cardiovascular:     Rate and Rhythm: Normal rate and regular rhythm.  Pulmonary:     Effort: Pulmonary effort is normal.     Breath sounds: Normal breath sounds.  Skin:    Findings: Wound (appears to be healing well. no purulent drainage noted. Open area to the center draining bloody drainage. ) present.       Neurological:     Mental Status: She is alert and oriented to person, place, and time.      Lab Results:  CBC    Component Value Date/Time   WBC 8.1 06/27/2020 1741   RBC 4.31 06/27/2020 1741   HGB 13.2 06/27/2020 1741   HCT 40.6 06/27/2020 1741   PLT 312 06/27/2020 1741   MCV 94.2 06/27/2020 1741   MCH 30.6 06/27/2020 1741   MCHC 32.5 06/27/2020 1741   RDW 12.8 06/27/2020 1741   LYMPHSABS 2.7 06/27/2020 1741   MONOABS 0.7 06/27/2020 1741   EOSABS 0.3 06/27/2020 1741   BASOSABS 0.0 06/27/2020 1741    BMET    Component Value Date/Time   NA 139 06/27/2020 1741   K 3.7 06/27/2020 1741   CL 104 06/27/2020 1741   CO2 24 06/27/2020 1741   GLUCOSE 89 06/27/2020 1741   BUN 6 06/27/2020 1741   CREATININE 0.86 06/27/2020 1741   CALCIUM 9.4 06/27/2020 1741    GFRNONAA >60 06/27/2020 1741   GFRAA >60 05/29/2019 1125     Imaging: DG Tibia/Fibula Left  Result Date: 06/27/2020 CLINICAL DATA:  Open wound to lower leg EXAM: LEFT TIBIA AND FIBULA - 2 VIEW COMPARISON:  05/18/2019 FINDINGS: Screw fixation of the medial malleolus. No acute displaced fracture or malalignment. No periostitis or bone destruction.  No soft tissue emphysema IMPRESSION: Screw fixation of medial malleolus. No acute osseous abnormality. Electronically Signed   By: Jasmine Pang M.D.   On: 06/27/2020 19:20     Assessment & Plan:   Wound of left leg Wound appears to be healing.  Will place referral to wound care due to wound not being completely closed at this point  Continue to keep area clean  May apply Vaseline to gauze pad  History of asthma:  Will send prescription for nebulizer machine  Will schedule appointment for patient to establish care with primary care provider  Follow up if needed     Ivonne Andrew, NP 07/10/2020

## 2020-07-10 ENCOUNTER — Encounter: Payer: Self-pay | Admitting: Nurse Practitioner

## 2020-07-10 DIAGNOSIS — S81802A Unspecified open wound, left lower leg, initial encounter: Secondary | ICD-10-CM | POA: Insufficient documentation

## 2020-07-10 DIAGNOSIS — J4520 Mild intermittent asthma, uncomplicated: Secondary | ICD-10-CM | POA: Insufficient documentation

## 2020-07-10 NOTE — Assessment & Plan Note (Signed)
Wound appears to be healing.  Will place referral to wound care due to wound not being completely closed at this point  Continue to keep area clean  May apply Vaseline to gauze pad  History of asthma:  Will send prescription for nebulizer machine  Will schedule appointment for patient to establish care with primary care provider  Follow up if needed

## 2020-07-16 ENCOUNTER — Ambulatory Visit: Payer: Medicaid Other | Admitting: Nurse Practitioner

## 2020-07-26 DIAGNOSIS — H5213 Myopia, bilateral: Secondary | ICD-10-CM | POA: Diagnosis not present

## 2020-08-12 ENCOUNTER — Encounter (HOSPITAL_BASED_OUTPATIENT_CLINIC_OR_DEPARTMENT_OTHER): Payer: Medicaid Other | Admitting: Physician Assistant

## 2020-08-12 DIAGNOSIS — D72829 Elevated white blood cell count, unspecified: Secondary | ICD-10-CM | POA: Diagnosis not present

## 2020-08-12 DIAGNOSIS — Z880 Allergy status to penicillin: Secondary | ICD-10-CM | POA: Diagnosis not present

## 2020-08-12 DIAGNOSIS — M549 Dorsalgia, unspecified: Secondary | ICD-10-CM | POA: Diagnosis not present

## 2020-08-12 DIAGNOSIS — Z885 Allergy status to narcotic agent status: Secondary | ICD-10-CM | POA: Diagnosis not present

## 2020-08-12 DIAGNOSIS — R300 Dysuria: Secondary | ICD-10-CM | POA: Diagnosis not present

## 2020-08-12 DIAGNOSIS — Z882 Allergy status to sulfonamides status: Secondary | ICD-10-CM | POA: Diagnosis not present

## 2020-08-12 DIAGNOSIS — R109 Unspecified abdominal pain: Secondary | ICD-10-CM | POA: Diagnosis not present

## 2020-08-12 DIAGNOSIS — N12 Tubulo-interstitial nephritis, not specified as acute or chronic: Secondary | ICD-10-CM | POA: Diagnosis not present

## 2020-08-13 ENCOUNTER — Telehealth: Payer: Self-pay

## 2020-08-13 NOTE — Telephone Encounter (Signed)
Transition Care Management Unsuccessful Follow-up Telephone Call  Date of discharge and from where:  08/12/2020 [facility] ED  Attempts:  1st Attempt  Reason for unsuccessful TCM follow-up call:  Left voice message

## 2020-08-17 NOTE — Telephone Encounter (Signed)
Transition Care Management Unsuccessful Follow-up Telephone Call  Date of discharge and from where:  1/5/2022[place] ED  Attempts:  2nd Attempt  Reason for unsuccessful TCM follow-up call:  No answer/busy

## 2020-08-18 NOTE — Telephone Encounter (Signed)
Transition Care Management Unsuccessful Follow-up Telephone Call  Date of discharge and from where:  08/12/2020 [facility] ED  Attempts:  3rd Attempt  Reason for unsuccessful TCM follow-up call:  Unable to leave message
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
## 2022-07-15 ENCOUNTER — Other Ambulatory Visit: Payer: Self-pay

## 2022-07-15 ENCOUNTER — Emergency Department (HOSPITAL_COMMUNITY)
Admission: EM | Admit: 2022-07-15 | Discharge: 2022-07-15 | Disposition: A | Discharge status: Home / Self Care | Payer: Medicaid Other | Attending: Emergency Medicine | Admitting: Emergency Medicine

## 2022-07-15 ENCOUNTER — Encounter (HOSPITAL_COMMUNITY): Payer: Self-pay

## 2022-07-15 ENCOUNTER — Emergency Department (HOSPITAL_COMMUNITY): Payer: Medicaid Other

## 2022-07-15 DIAGNOSIS — F4321 Adjustment disorder with depressed mood: Secondary | ICD-10-CM | POA: Diagnosis not present

## 2022-07-15 DIAGNOSIS — R079 Chest pain, unspecified: Secondary | ICD-10-CM | POA: Diagnosis not present

## 2022-07-15 DIAGNOSIS — R0789 Other chest pain: Secondary | ICD-10-CM | POA: Diagnosis not present

## 2022-07-15 DIAGNOSIS — Z79899 Other long term (current) drug therapy: Secondary | ICD-10-CM | POA: Insufficient documentation

## 2022-07-15 DIAGNOSIS — I10 Essential (primary) hypertension: Secondary | ICD-10-CM | POA: Insufficient documentation

## 2022-07-15 DIAGNOSIS — Z634 Disappearance and death of family member: Secondary | ICD-10-CM | POA: Insufficient documentation

## 2022-07-15 DIAGNOSIS — Z7982 Long term (current) use of aspirin: Secondary | ICD-10-CM | POA: Diagnosis not present

## 2022-07-15 LAB — COMPREHENSIVE METABOLIC PANEL
ALT: 10 U/L (ref 0–44)
AST: 18 U/L (ref 15–41)
Albumin: 3.6 g/dL (ref 3.5–5.0)
Alkaline Phosphatase: 51 U/L (ref 38–126)
Anion gap: 6 (ref 5–15)
BUN: 8 mg/dL (ref 6–20)
CO2: 24 mmol/L (ref 22–32)
Calcium: 9 mg/dL (ref 8.9–10.3)
Chloride: 108 mmol/L (ref 98–111)
Creatinine, Ser: 0.8 mg/dL (ref 0.44–1.00)
GFR, Estimated: >60 mL/min (ref >60)
Glucose, Bld: 87 mg/dL (ref 70–99)
Potassium: 3.5 mmol/L (ref 3.5–5.1)
Sodium: 138 mmol/L (ref 135–145)
Total Bilirubin: 0.4 mg/dL (ref 0.3–1.2)
Total Protein: 6.8 g/dL (ref 6.5–8.1)

## 2022-07-15 LAB — CBC
HCT: 40 % (ref 36.0–46.0)
Hemoglobin: 13.2 g/dL (ref 12.0–15.0)
MCH: 31.3 pg (ref 26.0–34.0)
MCHC: 33 g/dL (ref 30.0–36.0)
MCV: 94.8 fL (ref 80.0–100.0)
Platelets: 212 10*3/uL (ref 150–400)
RBC: 4.22 MIL/uL (ref 3.87–5.11)
RDW: 13.6 % (ref 11.5–15.5)
WBC: 9.4 10*3/uL (ref 4.0–10.5)
nRBC: 0 % (ref 0.0–0.2)

## 2022-07-15 LAB — TROPONIN I (HIGH SENSITIVITY): Troponin I (High Sensitivity): 3 ng/L (ref <18)

## 2022-07-15 LAB — I-STAT BETA HCG BLOOD, ED (MC, WL, AP ONLY): I-stat hCG, quantitative: <5 m[IU]/mL (ref <5)

## 2022-07-15 MED ORDER — METOPROLOL TARTRATE 25 MG PO TABS: 25.0000 mg | ORAL_TABLET | Freq: Two times a day (BID) | ORAL | 2 refills | Status: AC

## 2022-07-15 MED ORDER — AMLODIPINE BESYLATE 5 MG PO TABS
5.0000 mg | ORAL_TABLET | Freq: Once | ORAL | Status: AC
  Administered 2022-07-15: 5 mg via ORAL
  Filled 2022-07-15: qty 1

## 2022-07-15 MED ORDER — AMLODIPINE BESYLATE 5 MG PO TABS: 5.0000 mg | ORAL_TABLET | Freq: Every day | ORAL | 2 refills | Status: AC

## 2022-07-15 MED ORDER — METOPROLOL TARTRATE 25 MG PO TABS
25.0000 mg | ORAL_TABLET | Freq: Once | ORAL | Status: AC
  Administered 2022-07-15: 25 mg via ORAL
  Filled 2022-07-15: qty 1

## 2022-07-15 NOTE — ED Notes (Signed)
PA in room at this time 

## 2022-07-15 NOTE — Progress Notes (Signed)
Chaplain responded to call from ED.  Patient grieving the loss of her 46 yo daughter, leaving behind 85 yo son and 50 month old.  Patient died of cancer after 06-30-2023 diagnosis. Family still reeling from loss.  Chaplain listened to pt's grief and offered support, including how to talk to her grandson about his mother's death. Rev. Lynnell Chad Pager [phone]

## 2022-07-15 NOTE — Discharge Instructions (Addendum)
Thank you for allowing me to be part of your care today.  Please call [phone] on Monday to establish a primary care provider for continued management of your blood pressure.  I have sent over your blood pressure prescriptions to the pharmacy.    Return to the ER if you develop any new or worsening symptoms, or if you have any new concerns.

## 2022-07-15 NOTE — ED Triage Notes (Addendum)
Patient reports that she began having intermittent left chest pain, but has been constant since last night.  Patient denies any SOB, N/V, or radiation of pain.  Patient added that she has not had her BP meds in a month. BP in triage -180/132

## 2022-07-15 NOTE — ED Provider Triage Note (Signed)
Emergency Medicine Provider Triage Evaluation Note  [name] , a 46 y.o. female  was evaluated in triage.  Pt complains of left-sided chest pain that began yesterday when she was driving.  She reports chest pain been constant since onset it is slightly worsened with physical activity.  Denies any radiation or feelings of shortness of breath.  She describes pain as a knife stabbing and twisting.  Denies history of similar symptoms.  She states that she has had increased stressors over the past several days planning for her daughter's funeral.  Has not taken blood pressure medicines for the past month due to family provider retiring.  Reports cigarette use; denies illicit drug use.  Review of Systems  Positive: See above Negative:   Physical Exam  BP (!) 180/132 (BP Location: Right Arm)   Pulse 75   Temp 98.4 F (36.9 C) (Oral)   Resp 16   Ht 4\' 9"  (1.448 m)   Wt 40.8 kg   LMP 06/12/2022 (Approximate)   SpO2 100%   BMI 19.48 kg/m  Gen:   Awake, no distress   Resp:  Normal effort  MSK:   Moves extremities without difficulty  Other:  No obvious murmurs gallops or rubs.  Lungs clear to auscultation bilaterally.  No lower extremity edema noted.  Medical Decision Making  Medically screening exam initiated at 1:25 PM.  Appropriate orders placed.  [name] was informed that the remainder of the evaluation will be completed by another provider, this initial triage assessment does not replace that evaluation, and the importance of remaining in the ED until their evaluation is complete.     13/12/2021, Peter Garter 07/15/22 1333

## 2022-07-15 NOTE — ED Provider Notes (Addendum)
[facility] DEPT Provider Note   CSN: [id number] Arrival date & time: 07/15/22  1250     History  Chief Complaint  Patient presents with   Chest Pain   Hypertension    [name] is a 46 y.o. female presents to the ED with complaint of intermittent left sided chest pain that became constant last night.  She states it is a sharp, stabbing pain that is on the left side, under her left breast and it radiates to her back.  Her pain started at rest while she was riding in the car and has progressively worsened.  Patient states her primary care doctor retired and she has not had her BP medications in at least a month.  Denies shortness of breath, nausea, vomiting, abdominal pain, weakness, syncope, palpitations, leg swelling, fever.  Patient reports she lost her daughter to cancer at the beginning of the month and is dealing with a significant amount of grief.         Home Medications Prior to Admission medications   Medication Sig Start Date End Date Taking? Authorizing Provider  acetaminophen (TYLENOL) 500 MG tablet Take 1,000 mg by mouth as needed for headache.   Yes [provider]  albuterol (PROVENTIL) (2.5 MG/3ML) 0.083% nebulizer solution Take 3 mLs (2.5 mg total) by nebulization every 6 (six) hours as needed (asthma). Asthma. Patient taking differently: Take 2.5 mg by nebulization in the morning, at noon, and at bedtime. Asthma. 06/27/20  Yes Garald Balding, PA-C  albuterol (VENTOLIN HFA) 108 (90 Base) MCG/ACT inhaler Inhale 2 puffs into the lungs every 6 (six) hours as needed (asthma). Asthma. Patient taking differently: Inhale 2 puffs into the lungs as needed (asthma). 06/27/20  Yes Garald Balding, PA-C  aspirin 81 MG chewable tablet Chew 81 mg by mouth daily.   Yes [provider]  Calcium Carbonate Antacid (TUMS PO) Take 1 tablet by mouth once.   Yes [provider]  Multiple Vitamins-Minerals (CENTRUM ULTRA  WOMENS) TABS Take 1 tablet by mouth daily.   Yes [provider]  amLODipine (NORVASC) 5 MG tablet Take 1 tablet (5 mg total) by mouth daily. 07/15/22   Olympia Adelsberger R, PA  metoprolol tartrate (LOPRESSOR) 25 MG tablet Take 1 tablet (25 mg total) by mouth 2 (two) times daily. 07/15/22   Theressa Stamps R, PA      Allergies    Cephalexin, Naproxen, Penicillins, Codeine, and Sulfa antibiotics    Review of Systems   Review of Systems  Constitutional:  Negative for fever.  Respiratory:  Negative for chest tightness and shortness of breath.   Cardiovascular:  Positive for chest pain. Negative for palpitations and leg swelling.  Gastrointestinal:  Negative for abdominal pain, nausea and vomiting.  Musculoskeletal:  Positive for back pain.  Neurological:  Negative for syncope and weakness.    Physical Exam Updated Vital Signs BP (!) 175/124   Pulse 64   Temp (!) 97.4 F (36.3 C) (Oral)   Resp 16   Ht 4\' 9"  (1.448 m)   Wt 40.8 kg   LMP 06/12/2022 (Approximate)   SpO2 100%   BMI 19.48 kg/m  Physical Exam Vitals and nursing note reviewed.  Constitutional:      General: She is not in acute distress.    Appearance: She is not ill-appearing.  HENT:     Mouth/Throat:     Mouth: Mucous membranes are moist.     Pharynx: Oropharynx is clear.  Cardiovascular:  Rate and Rhythm: Normal rate and regular rhythm.     Pulses: Normal pulses.     Heart sounds: Normal heart sounds.  Pulmonary:     Effort: Pulmonary effort is normal. No accessory muscle usage or respiratory distress.     Breath sounds: Normal breath sounds and air entry.  Chest:     Chest wall: No tenderness.  Abdominal:     General: Abdomen is flat. Bowel sounds are normal. There is no distension.     Palpations: Abdomen is soft.     Tenderness: There is no abdominal tenderness.  Musculoskeletal:     Right lower leg: No edema.     Left lower leg: No edema.  Skin:    General: Skin is warm and dry.     Capillary  Refill: Capillary refill takes less than 2 seconds.     Coloration: Skin is not pale.  Neurological:     Mental Status: She is alert. Mental status is at baseline.  Psychiatric:        Mood and Affect: Affect is tearful.        Speech: Speech normal.        Behavior: Behavior normal.     Comments: Patient is extremely tearful while talking about the loss of her daughter     ED Results / Procedures / Treatments   Labs (all labs ordered are listed, but only abnormal results are displayed) Labs Reviewed  CBC  COMPREHENSIVE METABOLIC PANEL  I-STAT BETA HCG BLOOD, ED ([facility], [facility], [facility] ONLY)  TROPONIN I (HIGH SENSITIVITY)    EKG EKG Interpretation  Date/Time:  Friday July 15 2022 13:06:13 EST Ventricular Rate:  71 PR Interval:  124 QRS Duration: 123 QT Interval:  406 QTC Calculation: 442 R Axis:   96 Text Interpretation: Sinus rhythm RBBB and LPFB when comapred ot prior, similar appearnce. No STEMI Confirmed by Theda Belfast ([id number]) on 07/15/2022 3:28:38 PM  Radiology DG Chest 2 View  Result Date: 07/15/2022 CLINICAL DATA:  Chest pain EXAM: CHEST - 2 VIEW COMPARISON:  Chest radiograph dated November 17, 2013 FINDINGS: The heart size and mediastinal contours are within normal limits. Both lungs are clear. The visualized skeletal structures are unremarkable. IMPRESSION: No active cardiopulmonary disease. Electronically Signed   By: Larose Hires D.O.   On: 07/15/2022 14:13    Procedures Procedures    Medications Ordered in ED Medications  amLODipine (NORVASC) tablet 5 mg (5 mg Oral Given 07/15/22 1343)  metoprolol tartrate (LOPRESSOR) tablet 25 mg (25 mg Oral Given 07/15/22 1343)    ED Course/ Medical Decision Making/ A&P           HEART Score: 3                Medical Decision Making Amount and/or Complexity of Data Reviewed Labs: ordered. Radiology: ordered.  Risk Prescription drug management.   This patient presents to the ED with chief complaint(s) of left sided  chest pain that radiates to her back with pertinent past medical history of hypertension, not currently taking prescribed medication .The complaint involves an extensive differential diagnosis and also carries with it a high risk of complications and morbidity.    The differential diagnosis includes ACS, angina, Takotsubo cardiomyopathy, hypertensive emergency, hypertensive urgency, psychologic chest pain secondary to grief  The initial plan is to order baseline labs including CBC, CMP, troponin, and obtain CXR and ECG  Initial Assessment:   On exam, patient is resting comfortably in bed and does not  appear to be in acute distress.  When asking questions, patient becomes very tearful and talks about losing her daughter to cancer earlier this month and she has been having a difficult time processing the loss and her grief.  Patient also states her PCP retired and she has been out of her BP medications.  Lungs clear to auscultation bilaterally.  Heart rate and rhythm normal, normal heart sounds.  Pulses normal.  Skin warm and dry.  Chest pain is not reproducible with palpation or breathing.  Abdomen is soft and non tender.  No bilateral pedal edema.    Independent ECG/labs interpretation:  The following labs were independently interpreted:  CBC without evidence of leukocytosis or anemia.  Platelet counts are also normal.  Metabolic panel demonstrates no electrolyte disturbance.  Liver and kidney function both appear normal. Negative troponin.  ECG demonstrates sinus rhythm without evidence of infarction, ischemia, or ectopy.   Independent visualization and interpretation of imaging: I independently visualized the following imaging with scope of interpretation limited to determining acute life threatening conditions related to emergency care: CXR, which revealed no infiltrate suggestive of pneumonia, no pneumothorax, and no pleural effusions.  Heart is of normal size.  Treatment and  Reassessment: Patient treated in ED with home medications of amlodipine and metoprolol.  Will continue to monitor patient's blood pressure and response to medication.  Patient also expresses having a difficult time dealing with grief, and when asked if she would like to speak to someone about it, she states she would like to speak with a chaplain.  Consult placed for chaplain to come by and speak with patient.  Will also provide patient information for establishing primary care provider.  Will provide patient information for behavioral health services should she need them during grief processing.   Disposition:   After consideration of the diagnostic results and the patients response to treatment, I feel that emergency department workup does not suggest an emergent condition requiring admission or immediate intervention beyond what has been performed at this time.  The patient is safe for discharge and has been instructed to return immediately for worsening symptoms, change in symptoms or any other concerns.  Patient blood pressure has improved while being in ED.  Patient was able to speak with the chaplain and states she feels better.  Laboratory work up was reassuring for no ACS at this time.  Suspect her symptoms are related to uncontrolled hypertension and grief.  Amlodipine and metoprolol sent to pharmacy with refills for uncontrolled hypertension.    Discussed HPI, physical exam findings, assessment and plan with attending Antony Blackbird who agrees with current plan.          Final Clinical Impression(s) / ED Diagnoses Final diagnoses:  Chest pain, unspecified type  Grief at loss of child    Rx / DC Orders ED Discharge Orders          Ordered    metoprolol tartrate (LOPRESSOR) 25 MG tablet  2 times daily        07/15/22 1817    amLODipine (NORVASC) 5 MG tablet  Daily        07/15/22 1817              Pat Kocher, PA 07/15/22 1820    Pat Kocher, [place] 07/15/22  1822    Tegeler, Gwenyth Allegra, MD 07/15/22 [phone]

## 2022-07-19 ENCOUNTER — Telehealth: Payer: Self-pay | Admitting: *Deleted

## 2022-07-19 NOTE — Patient Outreach (Signed)
  Care Coordination [facility] Note Transition Care Management Unsuccessful Follow-up Telephone Call  Date of discharge and from where:  07/15/22 from [facility] ED  Attempts:  1st Attempt  Reason for unsuccessful TCM follow-up call:  Left voice message   Estanislado Emms RN, BSN [place]  [facility] RN Care Coordinator

## 2022-09-06 DIAGNOSIS — Z0000 Encounter for general adult medical examination without abnormal findings: Secondary | ICD-10-CM | POA: Diagnosis not present

## 2022-09-06 DIAGNOSIS — R5383 Other fatigue: Secondary | ICD-10-CM | POA: Diagnosis not present

## 2022-09-06 DIAGNOSIS — R030 Elevated blood-pressure reading, without diagnosis of hypertension: Secondary | ICD-10-CM | POA: Diagnosis not present

## 2022-09-06 DIAGNOSIS — Z1159 Encounter for screening for other viral diseases: Secondary | ICD-10-CM | POA: Diagnosis not present

## 2022-09-06 DIAGNOSIS — E559 Vitamin D deficiency, unspecified: Secondary | ICD-10-CM | POA: Diagnosis not present

## 2022-09-06 DIAGNOSIS — Z681 Body mass index (BMI) 19 or less, adult: Secondary | ICD-10-CM | POA: Diagnosis not present

## 2022-09-09 ENCOUNTER — Encounter: Payer: Self-pay | Admitting: Neurology
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
## 2022-09-14 ENCOUNTER — Ambulatory Visit (INDEPENDENT_AMBULATORY_CARE_PROVIDER_SITE_OTHER): Payer: Medicaid Other | Admitting: Neurology

## 2022-09-14 ENCOUNTER — Encounter: Payer: Self-pay | Admitting: Neurology

## 2022-09-14 VITALS — BP 121/79 | HR 69 | Ht <= 58 in | Wt 90.0 lb

## 2022-09-14 DIAGNOSIS — R29818 Other symptoms and signs involving the nervous system: Secondary | ICD-10-CM

## 2022-09-14 DIAGNOSIS — R200 Anesthesia of skin: Secondary | ICD-10-CM

## 2022-09-14 DIAGNOSIS — G35 Multiple sclerosis: Secondary | ICD-10-CM

## 2022-09-14 DIAGNOSIS — R531 Weakness: Secondary | ICD-10-CM | POA: Diagnosis not present

## 2022-09-14 DIAGNOSIS — R471 Dysarthria and anarthria: Secondary | ICD-10-CM

## 2022-09-14 NOTE — Patient Instructions (Addendum)
I saw you today for left side weakness and numbness, difficulty with speech, and history of multiple sclerosis. I need to investigate further to make sure we have the correct diagnosis and know the extent of your disease and what medications might work for you, assuming this is MS.  I would like to do blood work today.  I need to repeat your MRI brain and spinal cord.  I will be in touch when I have your results.  I am referring you to physical therapy for your left sided symptoms.  I recommend you take vitamin D3 1000 international units (IU) every day.  I want to see you back after your testing to discuss next steps, including possible treatment for MS, in about 1 month.  Please let me know if you have any questions or concerns in the meantime.   The physicians and staff at [facility] are committed to providing excellent care. You may receive a survey requesting feedback about your experience at our office. We strive to receive "very good" responses to the survey questions. If you feel that your experience would prevent you from giving the office a "very good " response, please contact our office to try to remedy the situation. We may be reached at [phone]. Thank you for taking the time out of your busy day to complete the survey.  [name], MD [facility]  Preventing Falls at [facility] are common, often dreaded events in the lives of older people. Aside from the obvious injuries and even death that may result, fall can cause wide-ranging consequences including loss of independence, mental decline, decreased activity and mobility. Younger people are also at risk of falling, especially those with chronic illnesses and fatigue.  Ways to reduce risk for falling Examine diet and medications. Warm foods and alcohol dilate blood vessels, which can lead to dizziness when standing. Sleep aids, antidepressants and pain medications can also increase the likelihood of a  fall.  Get a vision exam. Poor vision, cataracts and glaucoma increase the chances of falling.  Check foot gear. Shoes should fit snugly and have a sturdy, nonskid sole and a broad, low heel  Participate in a physician-approved exercise program to build and maintain muscle strength and improve balance and coordination. Programs that use ankle weights or stretch bands are excellent for muscle-strengthening. Water aerobics programs and low-impact Tai Chi programs have also been shown to improve balance and coordination.  Increase vitamin D intake. Vitamin D improves muscle strength and increases the amount of calcium the body is able to absorb and deposit in bones.  How to prevent falls from common hazards Floors - Remove all loose wires, cords, and throw rugs. Minimize clutter. Make sure rugs are anchored and smooth. Keep furniture in its usual place.  Chairs -- Use chairs with straight backs, armrests and firm seats. Add firm cushions to existing pieces to add height.  Bathroom - Install grab bars and non-skid tape in the tub or shower. Use a bathtub transfer bench or a shower chair with a back support Use an elevated toilet seat and/or safety rails to assist standing from a low surface. Do not use towel racks or bathroom tissue holders to help you stand.  Lighting - Make sure halls, stairways, and entrances are well-lit. Install a night light in your bathroom or hallway. Make sure there is a light switch at the top and bottom of the staircase. Turn lights on if you get up in the middle of the  night. Make sure lamps or light switches are within reach of the bed if you have to get up during the night.  Kitchen - Install non-skid rubber mats near the sink and stove. Clean spills immediately. Store frequently used utensils, pots, pans between waist and eye level. This helps prevent reaching and bending. Sit when getting things out of lower cupboards.  Living room/ Bedrooms - Place furniture with  wide spaces in between, giving enough room to move around. Establish a route through the living room that gives you something to hold onto as you walk.  Stairs - Make sure treads, rails, and rugs are secure. Install a rail on both sides of the stairs. If stairs are a threat, it might be helpful to arrange most of your activities on the lower level to reduce the number of times you must climb the stairs.  Entrances and doorways - Install metal handles on the walls adjacent to the doorknobs of all doors to make it more secure as you travel through the doorway.  Tips for maintaining balance Keep at least one hand free at all times. Try using a backpack or fanny pack to hold things rather than carrying them in your hands. Never carry objects in both hands when walking as this interferes with keeping your balance.  Attempt to swing both arms from front to back while walking. This might require a conscious effort if Parkinson's disease has diminished your movement. It will, however, help you to maintain balance and posture, and reduce fatigue.  Consciously lift your feet off of the ground when walking. Shuffling and dragging of the feet is a common culprit in losing your balance.  When trying to navigate turns, use a "U" technique of facing forward and making a wide turn, rather than pivoting sharply.  Try to stand with your feet shoulder-length apart. When your feet are close together for any length of time, you increase your risk of losing your balance and falling.  Do one thing at a time. Don't try to walk and accomplish another task, such as reading or looking around. The decrease in your automatic reflexes complicates motor function, so the less distraction, the better.  Do not wear rubber or gripping soled shoes, they might "catch" on the floor and cause tripping.  Move slowly when changing positions. Use deliberate, concentrated movements and, if needed, use a grab bar or walking aid. Count 15  seconds between each movement. For example, when rising from a seated position, wait 15 seconds after standing to begin walking.  If balance is a continuous problem, you might want to consider a walking aid such as a cane, walking stick, or walker. Once you've mastered walking with help, you might be ready to try it on your own again.

## 2022-09-20 ENCOUNTER — Ambulatory Visit: Payer: Medicaid Other | Attending: Neurology | Admitting: Physical Therapy

## 2022-09-20 ENCOUNTER — Encounter: Payer: Self-pay | Admitting: Physical Therapy

## 2022-09-20 ENCOUNTER — Other Ambulatory Visit (INDEPENDENT_AMBULATORY_CARE_PROVIDER_SITE_OTHER): Payer: Medicaid Other

## 2022-09-20 DIAGNOSIS — R2689 Other abnormalities of gait and mobility: Secondary | ICD-10-CM | POA: Diagnosis not present

## 2022-09-20 DIAGNOSIS — R2681 Unsteadiness on feet: Secondary | ICD-10-CM | POA: Diagnosis not present

## 2022-09-20 DIAGNOSIS — M6281 Muscle weakness (generalized): Secondary | ICD-10-CM | POA: Insufficient documentation

## 2022-09-20 DIAGNOSIS — G35 Multiple sclerosis: Secondary | ICD-10-CM | POA: Diagnosis not present

## 2022-09-20 DIAGNOSIS — R200 Anesthesia of skin: Secondary | ICD-10-CM

## 2022-09-20 DIAGNOSIS — R471 Dysarthria and anarthria: Secondary | ICD-10-CM

## 2022-09-20 DIAGNOSIS — R29818 Other symptoms and signs involving the nervous system: Secondary | ICD-10-CM | POA: Diagnosis not present

## 2022-09-20 DIAGNOSIS — R531 Weakness: Secondary | ICD-10-CM

## 2022-09-20 NOTE — Therapy (Signed)
OUTPATIENT PHYSICAL THERAPY NEURO EVALUATION   Patient Name: [name] MRN: [id number] DOB:[birth year], 47 y.o., female Today's Date: 09/20/2022   PCP: Selena Batten, NP  REFERRING PROVIDER: Shellia Carwin, MD  END OF SESSION:  PT End of Session - 09/20/22 1602     Visit Number 1    Number of Visits 7    Date for PT Re-Evaluation 11/19/22    Authorization Type [place] [employer]    PT Start Time 1447    PT Stop Time 1532    PT Time Calculation (min) 45 min    Equipment Utilized During Treatment Gait belt    Activity Tolerance Patient tolerated treatment well    Behavior During Therapy WFL for tasks assessed/performed             Past Medical History:  Diagnosis Date   Anxiety    Asthma    Bulging disc    Depression    Headache    05/29/2019- has had several a month, now every other month   History of transfusion    2003- pneumonia and pregnant   Hypertension    MS (multiple sclerosis) ([place])    Pneumonia    last time 2003   Stroke [facility])    "multiple strokes- left side weakness"   Syncope    Past Surgical History:  Procedure Laterality Date   ORIF ANKLE FRACTURE Left 05/29/2019   Procedure: OPEN REDUCTION INTERNAL FIXATION (ORIF) LEFT ANKLE FRACTURE;  Surgeon: Newt Minion, MD;  Location: [facility];  Service: Orthopedics;  Laterality: Left;   TUBAL LIGATION     WISDOM TOOTH EXTRACTION     Patient Active Problem List   Diagnosis Date Noted   Wound of left leg 07/10/2020   Mild intermittent asthma without complication [id number]   Closed nondisplaced fracture of medial malleolus of left tibia    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 05/27/2014   Contracture of muscle of left upper arm 05/27/2014   UTI (urinary tract infection) 05/27/2014   Dysarthria 05/25/2014   Anxiety 05/25/2014   Malignant hypertension 05/25/2014   Muscle tone atonic 02/05/2014   Multiple sclerosis ([place]) 12/22/2013   Syncope and collapse 12/22/2013    ONSET DATE:  09/14/2022 (date of referral)  REFERRING DIAG: R53.1 (ICD-10-CM) - Left-sided weakness G35 (ICD-10-CM) - Multiple sclerosis ([place]) R47.1 (ICD-10-CM) - Dysarthria R29.818 (ICD-10-CM) - Neurologic complaint, functional R20.0 (ICD-10-CM) - Left sided numbness   THERAPY DIAG:  Unsteadiness on feet  Other abnormalities of gait and mobility  Other symptoms and signs involving the nervous system  Muscle weakness (generalized)  Rationale for Evaluation and Treatment: Rehabilitation  SUBJECTIVE:  SUBJECTIVE STATEMENT: Was diagnosed with MS in 2005. Has not been treated for MS. Reports her MS episodes cause her to have paralysis on her L side. Reports current flare-up started January 22nd. Reports when she has these episodes, she is unable to walk. Reports it affects her whole L side. Reports when she does not have an episode, has clear speech and has no trouble walking (does not need to use an AD) and was out fishing. Reports episodes can last 6-9 months. Has had about 16 of these episodes. Last one was a few years ago. She is currently having an episode since 08/29/22. Right now, [name] has to wheel her around in her rollator as she is unable to walk. Reports if she has to get up in the middle of the night, is crawling on the floor. No falls. Pt sits with her L eye closed, reports when she is not in an episode, then is able to open her eyes. [name] is helping with bathing, dressing, ADLs. [name] reports at nighttime when she is sleeping, is able to move her L side. Prior to current episode, pt was able to do everything on her own and be independent. Used to have a brace for her L ankle, but it has gotten too old.   Pt accompanied by:  boyfriend, [name]   PERTINENT HISTORY: PMH: HTN, asthma, MS (diagnosed in 2005), right  vertebral artery dissection (2006), vit D deficiency, tobacco use, hx of multiple strokes with L sided weakness, hx of L ORIF ankle fx 05/2019, and anxiety who presents to neurology clinic with left sided weakness and speech difficulty.   She had IV solumedrol in hospital in 2015, but otherwise have never been treated for her MS per her report.  MRI scheduled 10/02/22  Per [name], MD: Her neurological examination is pertinent for distractible weakness of left upper eyelid, contractures of left, and non-physiologic sounding dysarthria.  Patient was diagnosed with MS in 2005. She had paralysis of her left side. Her boyfriend says if she gets upset, she will have the left side of her body go weak and numb. She has left face weakness as well. She has difficulty speaking with dysarthric speak. She cannot eat well during an episode. Per boyfriend she has had 16 episodes all with the same left sided symptoms and difficulty speaking. Per boyfriend, at night when she is sleeping she can move that side okay. She is also able to be understood when she first wakes up.    PAIN:  Are you having pain? Yes: NPRS scale: 4/10 Pain location: R side of head and on top  Pain description: Aching Aggravating factors: Sunlight Relieving factors: Laying down   PRECAUTIONS: Fall  WEIGHT BEARING RESTRICTIONS: No  FALLS: Has patient fallen in last 6 months? No  LIVING ENVIRONMENT: Lives with:  lives with boyfriend and their kids  Lives in: House/apartment Stairs: Yes: External: 2 steps; pt's boyfriend built a ramp so he can wheel her in and out  Has following equipment at home: Gilford Rile - 4 wheeled and shower chair  PLOF: Independent, Vocation/Vocational requirements: On disability, Leisure: Fishing, spending time with her grandkids, and prior to episode, pt was driving.   PATIENT GOALS: Wants to walk, get her speech back.   OBJECTIVE:   DIAGNOSTIC FINDINGS: MRI brain 2015: IMPRESSION:  Findings consistent  with chronic MS.   No evidence for acute stroke or acute plaque activity.   RIGHT vertebral is diminutive; see discussion above. LEFT vertebral  widely patent  and contributes to formation of the basilar.   Pt scheduled for updated MRI on 10/02/22  COGNITION: Overall cognitive status:  Pt with dysarthric speech, difficult to understand   SENSATION: Light touch: Impaired  and Unable to detect any light touch with LLE  Proprioception: Impaired  and WNL for RLE at R ankle, unable to detect PF/DF at L ankle   COORDINATION: Heel to shin: WNL RLE, Unable to perform with LLE, pt has to pick up L side to drag it up and down  OBSERVATIONS: Sits with L eye closed, L arm and LLE contracted into a flexed position LLE in windswept-like position, with hip ADD, foot inverted    POSTURE: rounded shoulders, forward head, posterior pelvic tilt, and weight shift right  LOWER EXTREMITY ROM:    Pt with significantly decr L knee extension AROM in sitting, holds in flexed positioning, unable to straighten   Passive  Right Eval Left Eval  Hip flexion    Hip extension    Hip abduction    Hip adduction    Hip internal rotation    Hip external rotation    Knee flexion    Knee extension    Ankle dorsiflexion  Able to reach neutral with PROM  Ankle plantarflexion    Ankle inversion    Ankle eversion  WFL with PROM   (Blank rows = not tested)  LOWER EXTREMITY MMT:    MMT Right Eval Left Eval  Hip flexion 5 2-  Hip extension    Hip abduction 4+ 3-  Hip adduction 4+ 3-  Hip internal rotation    Hip external rotation    Knee flexion 5 2-  Knee extension 5 2-  Ankle dorsiflexion 5 2-  Ankle plantarflexion    Ankle inversion    Ankle eversion    (Blank rows = not tested)  All tested in sitting   TRANSFERS: Assistive device utilized: Environmental consultant - 2 wheeled  Sit to stand: CGA/min A Stand to sit: CGA  Pt able to scoot out towards the edge of chair, therapist helping to place L foot in  neutral positioning (initially L foot is held in inversion), pt able to press through RUE to stand and then put weight through LLE in standing  Pt transfers from rollator seat > chair with min A due to brakes not working. This is how pt transfers at home and has her bf help her   Pt is wheeled into session by bf in rollator seat with brakes that don't work   GAIT: Gait pattern: step through pattern, decreased step length- Right, decreased step length- Left, decreased stance time- Left, decreased stride length, decreased hip/knee flexion- Left, decreased ankle dorsiflexion- Left, Left hip hike, knee flexed in stance- Left, trunk flexed, and poor foot clearance- Left Distance walked: 18' Assistive device utilized: Environmental consultant - 2 wheeled Level of assistance: CGA and Min A Comments: With close chair follow   With swing phase with LLE, pt able to lift into hip/knee flexion, but has incr L foot inversion that causes foot to get caught on the floor, but pt able to bring it through into neutral foot position to get weight through LLE when taking a step with RLE. PT helped bring LLE through a couple of times, but majority pt able to do it on her own, but takes incr time. During stance phase, pt able to extend LLE (compared to flexed positioning in sitting).   In standing prior to gait, pt able to get weight  through LLE and open up L hand to grab onto RW (in seated pt has hand/wrist in flexed contracted position)  FUNCTIONAL TESTS:  5 times sit to stand: 38.44 seconds, using RUE support from chair, CGA from therapist.    TODAY'S TREATMENT:                                                                                                                              N/A during eval.    PATIENT EDUCATION: Education details: Clinical findings, POC, Medicaid visit limit, pt interested in referral for speech therapy with PT to send referral request, importance for safety for using a transport chair or a front  wheeled RW for gait vs. Pt's current rollator without working brakes (pt currently being pushed by bf in this). Pt does not have access to a transport chair, but reports is borrowing a motorized w/c from her family. May look into getting a referral for a transport chair.  Person educated: Patient and pt's bf [name] Education method: Explanation Education comprehension: verbalized understanding  HOME EXERCISE PROGRAM: Will provide at next session.   GOALS: Goals reviewed with patient? Yes  SHORT TERM GOALS: Target date: 10/11/2022  Pt will be independent with initial HEP with pt's bf assist in order to build upon functional gains made in therapy. Baseline: Dependent  Goal status: INITIAL  2.  Gait speed to be assessed with LTG written. Baseline: Not yet assessed, pt only able to ambulate 18'   Goal status: INITIAL  3.  Pt will ambulate at least 74' with RW and supervision in order to demo improved household mobility.  Baseline: 100' with CGA/min A  Goal status: INITIAL  4.  Pt will improve 5x sit<>stand to less than or equal to 33 sec with RUE support to demonstrate improved functional strength and transfer efficiency.  Baseline: 38.44 seconds, using RUE support from chair, CGA from therapist. Goal status: INITIAL    LONG TERM GOALS: Target date: 11/01/2022  Pt will be independent with final HEP with pt's bf assist in order to build upon functional gains made in therapy. Baseline: Dependent  Goal status: INITIAL  2.  Gait speed goal to be written as appropriate.  Baseline: Not yet assessed. Goal status: INITIAL  3.   Pt will improve 5x sit<>stand to less than or equal to 25 sec with RUE support to demonstrate improved functional strength and transfer efficiency.  Baseline: 38.44 seconds, using RUE support from chair, CGA from therapist. Goal status: INITIAL  4.   Pt will ambulate at least 100' with RW and supervision in order to demo improved household mobility.  Baseline: 37'  with CGA/min A  Goal status: INITIAL  5.  Pt and pt's bf will verbalize understanding fall prevention in the home. Baseline: Not yet educated on this.  Goal status: INITIAL   ASSESSMENT:  CLINICAL IMPRESSION: Patient is a 47 year old female referred to Neuro OPPT for L sided weakness,  MS. Per pt's chart, pt has had MS since 2005, but has not received treatment. Pt has recently seen neurology and is getting updated MRIs in the next couple of weeks.  The following deficits were present during the exam: impaired balance, decr ROM, impaired posture/positioning, gait abnormalities, decr strength, absent sensation and proprioception to LLE, difficulties with transfers, impaired coordination. Based on 5x sit <> stand, pt is an incr risk for falls. Pt able to ambulate a total of 18' with RW before needing to sit with CGA/min A and pt with incr LLE inversion during gait. Pt would benefit from skilled PT to address these impairments and functional limitations to maximize functional mobility independence and decr fall risk.    OBJECTIVE IMPAIRMENTS: Abnormal gait, decreased activity tolerance, decreased balance, decreased coordination, decreased endurance, decreased knowledge of condition, decreased knowledge of use of DME, decreased mobility, difficulty walking, decreased ROM, decreased strength, decreased safety awareness, hypomobility, impaired flexibility, impaired sensation, impaired UE functional use, postural dysfunction, and pain.   ACTIVITY LIMITATIONS: carrying, lifting, bending, standing, squatting, stairs, transfers, bed mobility, bathing, toileting, dressing, hygiene/grooming, and locomotion level  PARTICIPATION LIMITATIONS: cleaning, laundry, driving, shopping, community activity, yard work, and fishing  PERSONAL FACTORS: Behavior pattern, Past/current experiences, Time since onset of injury/illness/exacerbation, and 3+ comorbidities: HTN, asthma, MS (diagnosed in 2005), right vertebral artery  dissection (2006), vit D deficiency, tobacco use, hx of multiple strokes with L sided weakness, and anxiety   are also affecting patient's functional outcome.   REHAB POTENTIAL: Fair due to deficits   CLINICAL DECISION MAKING: Evolving/moderate complexity  EVALUATION COMPLEXITY: Moderate  PLAN:  PT FREQUENCY: 1x/week  PT DURATION: 8 weeks  PLANNED INTERVENTIONS: Therapeutic exercises, Therapeutic activity, Neuromuscular re-education, Balance training, Gait training, Patient/Family education, Self Care, Orthotic/Fit training, DME instructions, Manual therapy, and Re-evaluation  PLAN FOR NEXT SESSION: Initial HEP for strengthening, ROM as tolerated. Sit <> stands. Pt's bf is going to see if they can get a front wheeled RW for pt. Work on gait with [place], PT, DPT  09/20/2022, 4:35 PM     Check all possible CPT codes: [phone] - PT Re-evaluation, 97110- Therapeutic Exercise, [phone]- Neuro Re-education, [phone] - Gait Training, [phone] - Manual Therapy, [phone] - Therapeutic Activities, [phone] - Self Care, and [phone] - Orthotic Fit    Check all conditions that are expected to impact treatment: Neurological condition and Social determinants of health   If treatment provided at initial evaluation, no treatment charged due to lack of authorization.

## 2022-09-21 ENCOUNTER — Telehealth: Payer: Self-pay | Admitting: Physical Therapy

## 2022-09-21 LAB — VITAMIN B12: Vitamin B-12: 549 pg/mL (ref 200–1100)

## 2022-09-21 LAB — VITAMIN D 25 HYDROXY (VIT D DEFICIENCY, FRACTURES): Vit D, 25-Hydroxy: 138 ng/mL — ABNORMAL HIGH (ref 30–100)

## 2022-09-21 LAB — TSH: TSH: 0.97 mIU/L

## 2022-09-21 NOTE — Telephone Encounter (Signed)
Dr. Berdine Addison, Damien Fusi Bernabe was evaluated by PT on 09/20/22.  The patient would benefit from a speech therapy evaluation for dysarthria.    If you agree, please place an order in [facility] in [facility] or fax the order to [phone].  Thank you, Janann August, PT, DPT 09/21/22 8:33 AM    [facility] [address] [place], [place]  [address] Phone:  [phone] Fax:  [phone]

## 2022-09-22 ENCOUNTER — Other Ambulatory Visit: Payer: Self-pay

## 2022-09-22 DIAGNOSIS — R471 Dysarthria and anarthria: Secondary | ICD-10-CM

## 2022-09-22 LAB — QUANTIFERON-TB GOLD PLUS
Mitogen-NIL: >10 IU/mL
NIL: 0.05 IU/mL
QuantiFERON-TB Gold Plus: NEGATIVE
TB1-NIL: 0 IU/mL
TB2-NIL: 0.11 IU/mL

## 2022-09-27 ENCOUNTER — Telehealth: Payer: Self-pay | Admitting: Physical Therapy

## 2022-09-27 ENCOUNTER — Ambulatory Visit: Payer: Medicaid Other | Admitting: Physical Therapy

## 2022-09-27 DIAGNOSIS — R471 Dysarthria and anarthria: Secondary | ICD-10-CM | POA: Diagnosis not present

## 2022-09-27 DIAGNOSIS — R2689 Other abnormalities of gait and mobility: Secondary | ICD-10-CM | POA: Diagnosis not present

## 2022-09-27 DIAGNOSIS — M6281 Muscle weakness (generalized): Secondary | ICD-10-CM

## 2022-09-27 DIAGNOSIS — R200 Anesthesia of skin: Secondary | ICD-10-CM | POA: Diagnosis not present

## 2022-09-27 DIAGNOSIS — R29818 Other symptoms and signs involving the nervous system: Secondary | ICD-10-CM

## 2022-09-27 DIAGNOSIS — R2681 Unsteadiness on feet: Secondary | ICD-10-CM | POA: Diagnosis not present

## 2022-09-27 DIAGNOSIS — R531 Weakness: Secondary | ICD-10-CM | POA: Diagnosis not present

## 2022-09-27 DIAGNOSIS — G35 Multiple sclerosis: Secondary | ICD-10-CM | POA: Diagnosis not present

## 2022-09-27 NOTE — Telephone Encounter (Signed)
Dr. Berdine Addison, [name] is being treated by physical therapy for her MS with resulting L-sided weakness.  [name] will benefit from use of a Left AFO in order to improve safety with functional mobility.    If you agree, please submit request in EPIC under MD Order, Other Orders (list Left AFO in comments) or fax to [facility] at [phone].   Thank you, Excell Seltzer, PT, DPT, [facility] [address] [place], [place]  [address] Phone:  [phone] Fax:  [phone]
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
## 2022-09-28 ENCOUNTER — Other Ambulatory Visit: Payer: Self-pay

## 2022-09-28 ENCOUNTER — Encounter: Payer: Self-pay | Admitting: Neurology

## 2022-09-28 LAB — HEPB+HEPC+HIV PANEL
HIV Screen 4th Generation wRfx: NONREACTIVE
Hep B C IgM: NEGATIVE
Hep B Core Total Ab: NEGATIVE
Hep B E Ab: NEGATIVE
Hep B E Ag: NEGATIVE
Hep B Surface Ab, Qual: REACTIVE
Hep C Virus Ab: NONREACTIVE
Hepatitis B Surface Ag: NEGATIVE

## 2022-09-28 LAB — IMMUNOGLOBULINS A/E/G/M, SERUM
IgA/Immunoglobulin A, Serum: 303 mg/dL (ref 87–352)
IgE (Immunoglobulin E), Serum: 174 IU/mL (ref 6–495)
IgG (Immunoglobin G), Serum: 1185 mg/dL (ref 586–1602)
IgM (Immunoglobulin M), Srm: 243 mg/dL — ABNORMAL HIGH (ref 26–217)

## 2022-09-28 LAB — JC VIRUS DNA,PCR (WHOLE BLOOD): JC Virus DNA, PCR, Blood: NEGATIVE

## 2022-09-28 LAB — ANTI-MOG, SERUM: MOG Antibody, Cell-based IFA: NEGATIVE

## 2022-09-28 LAB — NEUROMYELITIS OPTICA AUTOAB, IGG: NMO IgG Autoantibodies: <1.5 U/mL (ref 0.0–3.0)

## 2022-09-29 ENCOUNTER — Telehealth: Payer: Self-pay | Admitting: Physical Therapy

## 2022-09-29 ENCOUNTER — Other Ambulatory Visit: Payer: Self-pay

## 2022-09-29 DIAGNOSIS — R200 Anesthesia of skin: Secondary | ICD-10-CM

## 2022-09-29 DIAGNOSIS — G35 Multiple sclerosis: Secondary | ICD-10-CM

## 2022-09-29 DIAGNOSIS — R531 Weakness: Secondary | ICD-10-CM

## 2022-09-29 NOTE — Telephone Encounter (Signed)
[name],  I was reaching out about the AFO order request for Ms. [name] and was told you had called in regards to this order. Please let me know what I can assist with.  Thank you, Excell Seltzer, PT, DPT, CSRS

## 2022-09-29 NOTE — Telephone Encounter (Signed)
Dr. Berdine Addison,  Thank you for the order for the Left AFO for Ms. Beverlyn Dillinger.Due to insurance requirements we are also being asked to provide documentation of your recommendation. Could you please addend your note from your latest visit with Ms. Binning from 09/14/2022 to include the recommendation for a "left AFO with toe cap" due to her LLE weakness from her MS?  Thank you, Excell Seltzer, PT, DPT, [facility] [address] [place], [place]  [address] Phone:  [phone] Fax:  [phone]

## 2022-09-29 NOTE — Telephone Encounter (Signed)
Called and left message as to what type of AFO she would like for the Pt.

## 2022-09-29 NOTE — Telephone Encounter (Signed)
Order done for AFO

## 2022-09-30 ENCOUNTER — Other Ambulatory Visit: Payer: Medicaid Other

## 2022-09-30 ENCOUNTER — Ambulatory Visit
Admission: RE | Admit: 2022-09-30 | Discharge: 2022-09-30 | Disposition: A | Discharge status: Home / Self Care | Payer: Medicaid Other | Source: Ambulatory Visit | Attending: Neurology | Admitting: Neurology

## 2022-09-30 ENCOUNTER — Ambulatory Visit: Payer: Medicaid Other | Admitting: Neurology

## 2022-09-30 DIAGNOSIS — G35 Multiple sclerosis: Secondary | ICD-10-CM

## 2022-09-30 DIAGNOSIS — R471 Dysarthria and anarthria: Secondary | ICD-10-CM

## 2022-09-30 DIAGNOSIS — R531 Weakness: Secondary | ICD-10-CM

## 2022-09-30 DIAGNOSIS — I63542 Cerebral infarction due to unspecified occlusion or stenosis of left cerebellar artery: Secondary | ICD-10-CM | POA: Diagnosis not present

## 2022-09-30 DIAGNOSIS — R200 Anesthesia of skin: Secondary | ICD-10-CM

## 2022-09-30 DIAGNOSIS — M4802 Spinal stenosis, cervical region: Secondary | ICD-10-CM | POA: Diagnosis not present

## 2022-09-30 DIAGNOSIS — M50223 Other cervical disc displacement at C6-C7 level: Secondary | ICD-10-CM | POA: Diagnosis not present

## 2022-09-30 DIAGNOSIS — R29818 Other symptoms and signs involving the nervous system: Secondary | ICD-10-CM

## 2022-09-30 MED ORDER — GADOPICLENOL 0.5 MMOL/ML IV SOLN
4.0000 mL | Freq: Once | INTRAVENOUS | Status: AC | PRN
  Administered 2022-09-30: 4 mL via INTRAVENOUS

## 2022-10-02 ENCOUNTER — Other Ambulatory Visit: Payer: Medicaid Other
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
## 2022-10-04 ENCOUNTER — Ambulatory Visit: Payer: Medicaid Other | Admitting: Physical Therapy

## 2022-10-06 DIAGNOSIS — I10 Essential (primary) hypertension: Secondary | ICD-10-CM | POA: Diagnosis not present

## 2022-10-06 DIAGNOSIS — Z681 Body mass index (BMI) 19 or less, adult: Secondary | ICD-10-CM | POA: Diagnosis not present

## 2022-10-06 DIAGNOSIS — G35 Multiple sclerosis: Secondary | ICD-10-CM | POA: Diagnosis not present

## 2022-10-10 DIAGNOSIS — Z79899 Other long term (current) drug therapy: Secondary | ICD-10-CM | POA: Diagnosis not present

## 2022-10-10 DIAGNOSIS — M7918 Myalgia, other site: Secondary | ICD-10-CM | POA: Diagnosis not present

## 2022-10-10 DIAGNOSIS — R1310 Dysphagia, unspecified: Secondary | ICD-10-CM | POA: Diagnosis not present

## 2022-10-10 DIAGNOSIS — M129 Arthropathy, unspecified: Secondary | ICD-10-CM | POA: Diagnosis not present

## 2022-10-10 DIAGNOSIS — M542 Cervicalgia: Secondary | ICD-10-CM | POA: Diagnosis not present

## 2022-10-11 ENCOUNTER — Encounter: Payer: Self-pay | Admitting: Physical Therapy

## 2022-10-11 ENCOUNTER — Ambulatory Visit: Payer: Medicaid Other | Attending: Neurology | Admitting: Physical Therapy

## 2022-10-11 DIAGNOSIS — R2689 Other abnormalities of gait and mobility: Secondary | ICD-10-CM | POA: Diagnosis not present

## 2022-10-11 DIAGNOSIS — R2681 Unsteadiness on feet: Secondary | ICD-10-CM | POA: Diagnosis not present

## 2022-10-11 DIAGNOSIS — R471 Dysarthria and anarthria: Secondary | ICD-10-CM | POA: Insufficient documentation

## 2022-10-11 DIAGNOSIS — R29818 Other symptoms and signs involving the nervous system: Secondary | ICD-10-CM | POA: Diagnosis not present

## 2022-10-11 DIAGNOSIS — M6281 Muscle weakness (generalized): Secondary | ICD-10-CM | POA: Diagnosis not present

## 2022-10-11 NOTE — Therapy (Signed)
OUTPATIENT PHYSICAL THERAPY NEURO TREATMENT   Patient Name: [name] MRN: [id number] DOB:09-08-1975, 47 y.o., female Today's Date: 10/12/2022   PCP: Selena Batten, NP  REFERRING PROVIDER: Shellia Carwin, MD  END OF SESSION:  PT End of Session - 10/11/22 1539     Visit Number 3    Number of Visits 7    Date for PT Re-Evaluation 11/19/22    Authorization Type [employer]    PT Start Time 1533    PT Stop Time [id number]    PT Time Calculation (min) 42 min    Equipment Utilized During Treatment Gait belt    Activity Tolerance Patient tolerated treatment well    Behavior During Therapy WFL for tasks assessed/performed              Past Medical History:  Diagnosis Date   Anxiety    Asthma    Bulging disc    Depression    Headache    05/29/2019- has had several a month, now every other month   History of transfusion    2003- pneumonia and pregnant   Hypertension    MS (multiple sclerosis) ([place])    Pneumonia    last time 2003   Stroke [facility])    "multiple strokes- left side weakness"   Syncope    Past Surgical History:  Procedure Laterality Date   ORIF ANKLE FRACTURE Left 05/29/2019   Procedure: OPEN REDUCTION INTERNAL FIXATION (ORIF) LEFT ANKLE FRACTURE;  Surgeon: Newt Minion, MD;  Location: [place];  Service: Orthopedics;  Laterality: Left;   TUBAL LIGATION     WISDOM TOOTH EXTRACTION     Patient Active Problem List   Diagnosis Date Noted   Wound of left leg 07/10/2020   Mild intermittent asthma without complication [id number]   Closed nondisplaced fracture of medial malleolus of left tibia    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 05/27/2014   Contracture of muscle of left upper arm 05/27/2014   UTI (urinary tract infection) 05/27/2014   Dysarthria 05/25/2014   Anxiety 05/25/2014   Malignant hypertension 05/25/2014   Muscle tone atonic 02/05/2014   Multiple sclerosis ([place]) 12/22/2013   Syncope and collapse 12/22/2013    ONSET DATE:  09/14/2022 (date of referral)  REFERRING DIAG: R53.1 (ICD-10-CM) - Left-sided weakness G35 (ICD-10-CM) - Multiple sclerosis ([place]) R47.1 (ICD-10-CM) - Dysarthria R29.818 (ICD-10-CM) - Neurologic complaint, functional R20.0 (ICD-10-CM) - Left sided numbness   THERAPY DIAG:  Unsteadiness on feet  Other abnormalities of gait and mobility  Other symptoms and signs involving the nervous system  Muscle weakness (generalized)  Rationale for Evaluation and Treatment: Rehabilitation  SUBJECTIVE:  SUBJECTIVE STATEMENT: Had to miss appt last week due to feeling sick. Pt reports that her bf tried going to [facility] last week and they did not have her info for the brace. Has tried some of the exercises at home. Had a fall when trying to get on the toilet by herself. [name]'s sister had to help her get off the floor.   Pt accompanied by:  boyfriend, [name]   PERTINENT HISTORY: PMH: HTN, asthma, MS (diagnosed in 2005), right vertebral artery dissection (2006), vit D deficiency, tobacco use, hx of multiple strokes with L sided weakness, hx of L ORIF ankle fx 05/2019, and anxiety who presents to neurology clinic with left sided weakness and speech difficulty.   She had IV solumedrol in hospital in 2015, but otherwise have never been treated for her MS per her report.  MRI scheduled 10/02/22  Per [name], MD: Her neurological examination is pertinent for distractible weakness of left upper eyelid, contractures of left, and non-physiologic sounding dysarthria.  Patient was diagnosed with MS in 2005. She had paralysis of her left side. Her boyfriend says if she gets upset, she will have the left side of her body go weak and numb. She has left face weakness as well. She has difficulty speaking with dysarthric speak. She  cannot eat well during an episode. Per boyfriend she has had 16 episodes all with the same left sided symptoms and difficulty speaking. Per boyfriend, at night when she is sleeping she can move that side okay. She is also able to be understood when she first wakes up.    PAIN:  Are you having pain? Yes: NPRS scale: 8/10 Pain location: Head, upper and lower back  Pain description: Aching Aggravating factors: Sunlight Relieving factors: Laying down   PRECAUTIONS: Fall  WEIGHT BEARING RESTRICTIONS: No  FALLS: Has patient fallen in last 6 months? No  LIVING ENVIRONMENT: Lives with:  lives with boyfriend and their kids  Lives in: House/apartment Stairs: Yes: External: 2 steps; pt's boyfriend built a ramp so he can wheel her in and out  Has following equipment at home: Gilford Rile - 4 wheeled and shower chair  PLOF: Independent, Vocation/Vocational requirements: On disability, Leisure: Fishing, spending time with her grandkids, and prior to episode, pt was driving.   PATIENT GOALS: Wants to walk, get her speech back.   OBJECTIVE:   DIAGNOSTIC FINDINGS: MRI brain 2015: IMPRESSION:  Findings consistent with chronic MS.   No evidence for acute stroke or acute plaque activity.   RIGHT vertebral is diminutive; see discussion above. LEFT vertebral  widely patent and contributes to formation of the basilar.   Pt scheduled for updated MRI on 10/02/22  COGNITION: Overall cognitive status:  Pt with dysarthric speech, difficult to understand   SENSATION: Light touch: Impaired  and Unable to detect any light touch with LLE  Proprioception: Impaired  and WNL for RLE at R ankle, unable to detect PF/DF at L ankle   COORDINATION: Heel to shin: WNL RLE, Unable to perform with LLE, pt has to pick up L side to drag it up and down  OBSERVATIONS: Sits with L eye closed, L arm and LLE contracted into a flexed position LLE in windswept-like position, with hip ADD, foot inverted    POSTURE: rounded  shoulders, forward head, posterior pelvic tilt, and weight shift right  LOWER EXTREMITY ROM:    Pt with significantly decr L knee extension AROM in sitting, holds in flexed positioning, unable to straighten   Passive  Right Eval Left Eval  Hip flexion    Hip extension    Hip abduction    Hip adduction    Hip internal rotation    Hip external rotation    Knee flexion    Knee extension    Ankle dorsiflexion  Able to reach neutral with PROM  Ankle plantarflexion    Ankle inversion    Ankle eversion  WFL with PROM   (Blank rows = not tested)  LOWER EXTREMITY MMT:    MMT Right Eval Left Eval  Hip flexion 5 2-  Hip extension    Hip abduction 4+ 3-  Hip adduction 4+ 3-  Hip internal rotation    Hip external rotation    Knee flexion 5 2-  Knee extension 5 2-  Ankle dorsiflexion 5 2-  Ankle plantarflexion    Ankle inversion    Ankle eversion    (Blank rows = not tested)  All tested in sitting    TODAY'S TREATMENT:                                                                                                                              THER EX: Supine on mat table:  Bridging x10 reps with cues for ROM (pt with decr ROM esp on L side) Alternating marching x10 reps each side with pt with decr AROM with LLE, verbal/tactile cues on how high to try to lift up LLE, cued for pt to look at L leg due to sensation deficits   GAIT: Continued to use LLE [place] PLS AFO, pt exhibits improved LLE clearance without foot drag and improved ankle control.  Gait pattern: decreased hip/knee flexion- Left and decreased ankle dorsiflexion- Left Distance walked: 71' x 1 (could have ambulated longer, but limited due to end of tx session) Assistive device utilized: Walker - 2 wheeled Level of assistance: CGA and Min A Comments: Pt's bf following with manual w/c, but rest break not needed. Cued for L knee flexion during terminal stance/swing which helped pt with foot clearance. Pt's L toe did  catch at times, which pt would also likely benefit from a toe cap to shoe.    THERAPEUTIC ACTIVITY: From clinic manual w/c > mat table, perform sit > stand with RW with CGA/min A and initial cues for proper LLE foot placement, pt taking incr time to put L hand on RW, but overall can do it on her own. With stand step transfer to mat table, cued for step placement and weight shifting.   Had discussion with pt and pt's bf about information for [facility] (they had not heard back regarding AFO and PT faxed over information last week). Discussed with pt about order for OT as pt still has deficits in LUE, pt in agreement for referral. Pt's husband asking about use of E-Stim for pt's LLE, discussed that this would not be a safe option as pt currently does not have any sensation in her  LLE.    PATIENT EDUCATION: Education details: See therapeutic activity, additions to HEP, printed out new calendar of appts.  Person educated: Patient and pt's bf [name] Education method: Theatre stage manager Education comprehension: verbalized understanding, returned demonstration, and needs further education  HOME EXERCISE PROGRAM: Access Code: [email or web address] URL: [email or web address] Date: 10/12/2022 Prepared by: Janann August  Exercises - Sit to Stand with Armchair  - 1 x daily - 7 x weekly - 3 sets - 10 reps - Supine Short Arc Quad  - 1 x daily - 7 x weekly - 3 sets - 10 reps - 5 sec hold - Seated Hamstring Stretch  - 1 x daily - 7 x weekly - 1 sets - 5 reps - 30 sec hold - Supine Bridge  - 1 x daily - 7 x weekly - 1-2 sets - 10 reps - Supine March  - 1 x daily - 7 x weekly - 1-2 sets - 10 reps  GOALS: Goals reviewed with patient? Yes  SHORT TERM GOALS: Target date: 10/11/2022  Pt will be independent with initial HEP with pt's bf assist in order to build upon functional gains made in therapy. Baseline: Dependent  Goal status: INITIAL  2.  Gait speed to be assessed with LTG written. Baseline:  Not yet assessed, pt only able to ambulate 18'   Goal status: INITIAL  3.  Pt will ambulate at least 14' with RW and supervision in order to demo improved household mobility.  Baseline: 21' with CGA/min A  Goal status: INITIAL  4.  Pt will improve 5x sit<>stand to less than or equal to 33 sec with RUE support to demonstrate improved functional strength and transfer efficiency.  Baseline: 38.44 seconds, using RUE support from chair, CGA from therapist. Goal status: INITIAL    LONG TERM GOALS: Target date: 11/01/2022  Pt will be independent with final HEP with pt's bf assist in order to build upon functional gains made in therapy. Baseline: Dependent  Goal status: INITIAL  2.  Gait speed goal to be written as appropriate.  Baseline: Not yet assessed. Goal status: INITIAL  3.   Pt will improve 5x sit<>stand to less than or equal to 25 sec with RUE support to demonstrate improved functional strength and transfer efficiency.  Baseline: 38.44 seconds, using RUE support from chair, CGA from therapist. Goal status: INITIAL  4.   Pt will ambulate at least 100' with RW and supervision in order to demo improved household mobility.  Baseline: 75' with CGA/min A  Goal status: INITIAL  5.  Pt and pt's bf will verbalize understanding fall prevention in the home. Baseline: Not yet educated on this.  Goal status: INITIAL   ASSESSMENT:  CLINICAL IMPRESSION: Today's skilled session continued to focus on gait training with L [place] PLS AFO for improved foot clearance and gait mechanics with use of RW and adding in supine strengthening exercises to HEP for pt's LLE. PT continues to demo improved L ankle control and foot clearance with AFO and wold benefit from an additional toe cap. PT will have to re-fax information over to [facility] as they still have not heard from them yet. Will continue to progress towards LTGs.     OBJECTIVE IMPAIRMENTS: Abnormal gait, decreased activity tolerance, decreased  balance, decreased coordination, decreased endurance, decreased knowledge of condition, decreased knowledge of use of DME, decreased mobility, difficulty walking, decreased ROM, decreased strength, decreased safety awareness, hypomobility, impaired flexibility, impaired sensation, impaired UE functional use, postural dysfunction, and pain.  ACTIVITY LIMITATIONS: carrying, lifting, bending, standing, squatting, stairs, transfers, bed mobility, bathing, toileting, dressing, hygiene/grooming, and locomotion level  PARTICIPATION LIMITATIONS: cleaning, laundry, driving, shopping, community activity, yard work, and fishing  PERSONAL FACTORS: Behavior pattern, Past/current experiences, Time since onset of injury/illness/exacerbation, and 3+ comorbidities: HTN, asthma, MS (diagnosed in 2005), right vertebral artery dissection (2006), vit D deficiency, tobacco use, hx of multiple strokes with L sided weakness, and anxiety   are also affecting patient's functional outcome.   REHAB POTENTIAL: Fair due to deficits   CLINICAL DECISION MAKING: Evolving/moderate complexity  EVALUATION COMPLEXITY: Moderate  PLAN:  PT FREQUENCY: 1x/week  PT DURATION: 8 weeks  PLANNED INTERVENTIONS: Therapeutic exercises, Therapeutic activity, Neuromuscular re-education, Balance training, Gait training, Patient/Family education, Self Care, Orthotic/Fit training, DME instructions, Manual therapy, and Re-evaluation  PLAN FOR NEXT SESSION: CHECK STGS!!!  how is HEP? Add to HEP for strengthening, ROM as tolerated. Sit <> stands. Work on gait with RW. Did we get AFO order from physician?   Arliss Journey, PT, DPT, 10/12/2022, 8:05 AM     Check all possible CPT codes: [phone] - PT Re-evaluation, 97110- Therapeutic Exercise, [phone]- Neuro Re-education, [phone] - Gait Training, [phone] - Manual Therapy, [phone] - Therapeutic Activities, [phone] - Self Care, and [phone] - Orthotic Fit    Check all conditions that are expected to impact  treatment: Neurological condition and Social determinants of health   If treatment provided at initial evaluation, no treatment charged due to lack of authorization.

## 2022-10-12 ENCOUNTER — Other Ambulatory Visit (INDEPENDENT_AMBULATORY_CARE_PROVIDER_SITE_OTHER): Payer: Medicaid Other

## 2022-10-12 ENCOUNTER — Ambulatory Visit: Payer: Medicaid Other | Admitting: Neurology

## 2022-10-12 ENCOUNTER — Encounter: Payer: Self-pay | Admitting: Neurology

## 2022-10-12 VITALS — BP 120/79 | HR 84 | Ht <= 58 in | Wt 86.6 lb

## 2022-10-12 DIAGNOSIS — Z131 Encounter for screening for diabetes mellitus: Secondary | ICD-10-CM | POA: Diagnosis not present

## 2022-10-12 DIAGNOSIS — I639 Cerebral infarction, unspecified: Secondary | ICD-10-CM | POA: Diagnosis not present

## 2022-10-12 DIAGNOSIS — R471 Dysarthria and anarthria: Secondary | ICD-10-CM | POA: Diagnosis not present

## 2022-10-12 DIAGNOSIS — R531 Weakness: Secondary | ICD-10-CM | POA: Diagnosis not present

## 2022-10-12 DIAGNOSIS — R29818 Other symptoms and signs involving the nervous system: Secondary | ICD-10-CM

## 2022-10-12 DIAGNOSIS — R200 Anesthesia of skin: Secondary | ICD-10-CM | POA: Diagnosis not present

## 2022-10-12 DIAGNOSIS — Z79899 Other long term (current) drug therapy: Secondary | ICD-10-CM | POA: Diagnosis not present

## 2022-10-12 LAB — LIPID PANEL
Cholesterol: 141 mg/dL (ref 0–200)
HDL: 39.5 mg/dL (ref >39.00)
LDL Cholesterol: 75 mg/dL (ref 0–99)
NonHDL: 101.1
Total CHOL/HDL Ratio: 4
Triglycerides: 133 mg/dL (ref 0.0–149.0)
VLDL: 26.6 mg/dL (ref 0.0–40.0)

## 2022-10-12 LAB — HEMOGLOBIN A1C: Hgb A1c MFr Bld: 5.5 % (ref 4.6–6.5)

## 2022-10-12 MED ORDER — ASPIRIN 81 MG PO CHEW: 81.0000 mg | CHEWABLE_TABLET | Freq: Every day | ORAL | 11 refills | Status: AC

## 2022-10-12 NOTE — Addendum Note (Signed)
Addended by: Renae Gloss on: 10/12/2022 03:28 PM   Modules accepted: Orders

## 2022-10-17 DIAGNOSIS — M7918 Myalgia, other site: Secondary | ICD-10-CM | POA: Diagnosis not present

## 2022-10-17 DIAGNOSIS — M542 Cervicalgia: Secondary | ICD-10-CM | POA: Diagnosis not present

## 2022-10-17 DIAGNOSIS — Z681 Body mass index (BMI) 19 or less, adult: Secondary | ICD-10-CM | POA: Diagnosis not present

## 2022-10-17 DIAGNOSIS — Z79899 Other long term (current) drug therapy: Secondary | ICD-10-CM | POA: Diagnosis not present

## 2022-10-18 ENCOUNTER — Encounter: Payer: Self-pay | Admitting: Speech Pathology

## 2022-10-18 ENCOUNTER — Ambulatory Visit: Payer: Medicaid Other | Admitting: Physical Therapy

## 2022-10-18 ENCOUNTER — Telehealth: Payer: Self-pay | Admitting: Physical Therapy

## 2022-10-18 ENCOUNTER — Ambulatory Visit: Payer: Medicaid Other | Admitting: Speech Pathology

## 2022-10-18 ENCOUNTER — Encounter: Payer: Self-pay | Admitting: Physical Therapy

## 2022-10-18 DIAGNOSIS — M6281 Muscle weakness (generalized): Secondary | ICD-10-CM

## 2022-10-18 DIAGNOSIS — R471 Dysarthria and anarthria: Secondary | ICD-10-CM | POA: Diagnosis not present

## 2022-10-18 DIAGNOSIS — R2681 Unsteadiness on feet: Secondary | ICD-10-CM

## 2022-10-18 DIAGNOSIS — R29818 Other symptoms and signs involving the nervous system: Secondary | ICD-10-CM

## 2022-10-18 DIAGNOSIS — R2689 Other abnormalities of gait and mobility: Secondary | ICD-10-CM
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
## 2022-10-18 NOTE — Patient Instructions (Signed)
shape  shirt  shoe  ship  chef  shot  shut  Shop  bushes  dishes  lotion  flashlight  fishing  ocean  milkshake  Pushing  brush  fish  push  dish  cash  leash  wash  Trash  sit  soup  salt  seal  sick  sing  Sun  baseball  dancer  gasoline  grasshopper  motorcycle  fossil  Pencil  bus  face  ice  grass  horse  yes  Address  Wellsite geologist fish  zucchini   zap  Zippy  present  music  puzzle  poison  blizzard  cheesecake   cousin  [place]  cheese  please  freeze  boys  knees  nose   hose  rose

## 2022-10-18 NOTE — Telephone Encounter (Signed)
Dr. Berdine Addison, [name] [place] is being treated by physical therapy for her MS with resulting L-sided weakness.  [name] will benefit from a two wheeled rolling walker in order to improve safety with functional mobility and ambulation and to decr fall risk.    If you agree, please submit request in EPIC under MD Order, Other Orders (list two wheeled rolling walker) or fax to [facility] at [phone].   Also, could you please addend your last note from 10/12/22 to add in that pt would benefit from a RW for improved safety with gait and to decr fall risk? (In order for insurance to cover it).    Thank you so much, Janann August, PT, DPT 10/18/22 4:03 PM    [facility] [address] [place], [place]  [address] Phone:  [phone] Fax:  [phone]

## 2022-10-18 NOTE — Therapy (Signed)
OUTPATIENT PHYSICAL THERAPY NEURO TREATMENT   Patient Name: [name] MRN: [id number] DOB:05-21-1976, 47 y.o., female Today's Date: 10/18/2022   PCP: Selena Batten, NP  REFERRING PROVIDER: Shellia Carwin, MD  END OF SESSION:  PT End of Session - 10/18/22 1532     Visit Number 4    Number of Visits 7    Date for PT Re-Evaluation 11/19/22    Authorization Type [place] [employer] [place] Medicaid - 5 visits approved 09/27/22 - 11/25/22    Authorization - Visit Number 3    Authorization - Number of Visits 5    PT Start Time [id number]    PT Stop Time [id number]    PT Time Calculation (min) 43 min    Equipment Utilized During Treatment Gait belt    Activity Tolerance Patient tolerated treatment well    Behavior During Therapy WFL for tasks assessed/performed              Past Medical History:  Diagnosis Date   Anxiety    Asthma    Bulging disc    Depression    Headache    05/29/2019- has had several a month, now every other month   History of transfusion    2003- pneumonia and pregnant   Hypertension    MS (multiple sclerosis) ([place])    Pneumonia    last time 2003   Stroke [employer])    "multiple strokes- left side weakness"   Syncope    Past Surgical History:  Procedure Laterality Date   ORIF ANKLE FRACTURE Left 05/29/2019   Procedure: OPEN REDUCTION INTERNAL FIXATION (ORIF) LEFT ANKLE FRACTURE;  Surgeon: Newt Minion, MD;  Location: [facility];  Service: Orthopedics;  Laterality: Left;   TUBAL LIGATION     WISDOM TOOTH EXTRACTION     Patient Active Problem List   Diagnosis Date Noted   Wound of left leg 07/10/2020   Mild intermittent asthma without complication [id number]   Closed nondisplaced fracture of medial malleolus of left tibia    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 05/27/2014   Contracture of muscle of left upper arm 05/27/2014   UTI (urinary tract infection) 05/27/2014   Dysarthria 05/25/2014   Anxiety 05/25/2014   Malignant hypertension 05/25/2014   Muscle  tone atonic 02/05/2014   Multiple sclerosis ([place]) 12/22/2013   Syncope and collapse 12/22/2013    ONSET DATE: 09/14/2022 (date of referral)  REFERRING DIAG: R53.1 (ICD-10-CM) - Left-sided weakness G35 (ICD-10-CM) - Multiple sclerosis ([place]) R47.1 (ICD-10-CM) - Dysarthria R29.818 (ICD-10-CM) - Neurologic complaint, functional R20.0 (ICD-10-CM) - Left sided numbness   THERAPY DIAG:  Unsteadiness on feet  Other abnormalities of gait and mobility  Other symptoms and signs involving the nervous system  Muscle weakness (generalized)  Rationale for Evaluation and Treatment: Rehabilitation  SUBJECTIVE:  SUBJECTIVE STATEMENT: Just had her speech therapy eval. Has an appt with [facility] coming up on the 20th. Saw the neurologist - going to have her spinal fluid tested for MS. No falls. Reports her old rollator broke, currently crawling on the floor. Or son has to pick her up. Reports that she has family looking at getting her another rollator or a w/c for home.   Pt accompanied by:  son, [name]   PERTINENT HISTORY: PMH: HTN, asthma, MS (diagnosed in 2005), right vertebral artery dissection (2006), vit D deficiency, tobacco use, hx of multiple strokes with L sided weakness, hx of L ORIF ankle fx 05/2019, and anxiety who presents to neurology clinic with left sided weakness and speech difficulty.   She had IV solumedrol in hospital in 2015, but otherwise have never been treated for her MS per her report.  MRI scheduled 10/02/22  Per [name], MD: Her neurological examination is pertinent for distractible weakness of left upper eyelid, contractures of left, and non-physiologic sounding dysarthria.  Patient was diagnosed with MS in 2005. She had paralysis of her left side. Her boyfriend says if she gets upset,  she will have the left side of her body go weak and numb. She has left face weakness as well. She has difficulty speaking with dysarthric speak. She cannot eat well during an episode. Per boyfriend she has had 16 episodes all with the same left sided symptoms and difficulty speaking. Per boyfriend, at night when she is sleeping she can move that side okay. She is also able to be understood when she first wakes up.    PAIN:  Are you having pain? Yes: NPRS scale: 8/10 Pain location: Head, entire L side  Pain description: Aching Aggravating factors: Sitting  Relieving factors: Laying down   PRECAUTIONS: Fall  WEIGHT BEARING RESTRICTIONS: No  FALLS: Has patient fallen in last 6 months? No  LIVING ENVIRONMENT: Lives with:  lives with boyfriend and their kids  Lives in: House/apartment Stairs: Yes: External: 2 steps; pt's boyfriend built a ramp so he can wheel her in and out  Has following equipment at home: Gilford Rile - 4 wheeled and shower chair  PLOF: Independent, Vocation/Vocational requirements: On disability, Leisure: Fishing, spending time with her grandkids, and prior to episode, pt was driving.   PATIENT GOALS: Wants to walk, get her speech back.   OBJECTIVE:   DIAGNOSTIC FINDINGS: MRI brain 2015: IMPRESSION:  Findings consistent with chronic MS.   No evidence for acute stroke or acute plaque activity.   RIGHT vertebral is diminutive; see discussion above. LEFT vertebral  widely patent and contributes to formation of the basilar.   Pt scheduled for updated MRI on 10/02/22  COGNITION: Overall cognitive status:  Pt with dysarthric speech, difficult to understand   SENSATION: Light touch: Impaired  and Unable to detect any light touch with LLE  Proprioception: Impaired  and WNL for RLE at R ankle, unable to detect PF/DF at L ankle   COORDINATION: Heel to shin: WNL RLE, Unable to perform with LLE, pt has to pick up L side to drag it up and down  OBSERVATIONS: Sits with L  eye closed, L arm and LLE contracted into a flexed position LLE in windswept-like position, with hip ADD, foot inverted    POSTURE: rounded shoulders, forward head, posterior pelvic tilt, and weight shift right  LOWER EXTREMITY ROM:    Pt with significantly decr L knee extension AROM in sitting, holds in flexed positioning, unable to straighten  Passive  Right Eval Left Eval  Hip flexion    Hip extension    Hip abduction    Hip adduction    Hip internal rotation    Hip external rotation    Knee flexion    Knee extension    Ankle dorsiflexion  Able to reach neutral with PROM  Ankle plantarflexion    Ankle inversion    Ankle eversion  WFL with PROM   (Blank rows = not tested)  LOWER EXTREMITY MMT:    MMT Right Eval Left Eval  Hip flexion 5 2-  Hip extension    Hip abduction 4+ 3-  Hip adduction 4+ 3-  Hip internal rotation    Hip external rotation    Knee flexion 5 2-  Knee extension 5 2-  Ankle dorsiflexion 5 2-  Ankle plantarflexion    Ankle inversion    Ankle eversion    (Blank rows = not tested)  All tested in sitting    TODAY'S TREATMENT:                                                                                                                               Therapeutic Exercise: SciFit with BUE/BLE at gear 1.0 for 6 minutes for ROM, improved activity tolerance, and strengthening. Pt able to improve ROM throughout with incr time.  Sit <> stands x10 reps without UE support with cues for full knee extension in standing, pt able to perform with equal weight bearing through BLE   GAIT: Continued to use LLE [place] PLS AFO, pt exhibits improved LLE clearance without foot drag and improved ankle control. Also trialed use of blue shoe cover today to simulate leather toe cap. Pt demonstrating improved foot clearance and toe not getting stuck on floor. Discussed if pt were to obtain one it would be an out of pocket cost (~$50) while at [facility] when getting  AFO  Gait pattern: decreased hip/knee flexion- Left and decreased ankle dorsiflexion- Left Distance walked: 230' x 1, 115' x 1  Assistive device utilized: Environmental consultant - 2 wheeled Level of assistance: SBA and CGA Comments: Pt's son following with manual w/c, but rest break not needed at all today.   Used L blue shoe cover for improved foot clearance which really helped today. Pt with incr inversion when ambulating and incr toe out with LLE. Cues to point L toe forwards (pt able to respond well to this cue for more neutral foot placement).    Therapeutic Activity: Goal Assessment: 5x sit <> stand: 14.5 seconds with RUE support and supervision  Gait speed: 25.1 seconds in 20' =  .80 ft/sec with RW and L AFO with blue shoe cover   Reviewed proper shoes for AFO - getting a size bigger in order to fit AFO. Pt asking about order for RW as she does not have one at home. PT describing process and will send order request to pt's neurologist  Discussed visit  limit again with Medicaid and will cancel PT visit next week in order to conserve visits once pt gets her own AFO and RW. Pt in agreement with plan.    PATIENT EDUCATION: Education details: See therapeutic activity and gait section  Person educated: Patient and pt's son Education method: Explanation and Handouts Education comprehension: verbalized understanding, returned demonstration, and needs further education  HOME EXERCISE PROGRAM: Access Code: [email or web address] URL: [email or web address] Date: 10/12/2022 Prepared by: Janann August  Exercises - Sit to Stand with Armchair  - 1 x daily - 7 x weekly - 3 sets - 10 reps - Supine Short Arc Quad  - 1 x daily - 7 x weekly - 3 sets - 10 reps - 5 sec hold - Seated Hamstring Stretch  - 1 x daily - 7 x weekly - 1 sets - 5 reps - 30 sec hold - Supine Bridge  - 1 x daily - 7 x weekly - 1-2 sets - 10 reps - Supine March  - 1 x daily - 7 x weekly - 1-2 sets - 10 reps  GOALS: Goals reviewed  with patient? Yes  SHORT TERM GOALS: Target date: 10/11/2022  Pt will be independent with initial HEP with pt's bf assist in order to build upon functional gains made in therapy. Baseline: pt performing at home  Goal status: MET  2.  Gait speed to be assessed with LTG written. Baseline: .80 ft/sec with RW and L AFO with blue shoe cover  Goal status: MET  3.  Pt will ambulate at least 22' with RW and supervision in order to demo improved household mobility.  Baseline: 5' with CGA/min A   230' x 1 with supervision  Goal status: MET  4.  Pt will improve 5x sit<>stand to less than or equal to 33 sec with RUE support to demonstrate improved functional strength and transfer efficiency.  Baseline: 38.44 seconds, using RUE support from chair, CGA from therapist.   14.5 seconds with RUE support and supervision  Goal status: MET    LONG TERM GOALS: Target date: 11/01/2022  Pt will be independent with final HEP with pt's bf assist in order to build upon functional gains made in therapy. Baseline: Dependent  Goal status: INITIAL  2.  Pt will improve gait speed with RW to at least 1.2 ft/sec in order to demo improved community mobility.  Baseline: .80 ft/sec with RW and L AFO with blue shoe cover  Goal status: INITIAL  3.   Pt will improve 5x sit<>stand to less than or equal to 25 sec with RUE support to demonstrate improved functional strength and transfer efficiency.  Baseline: 38.44 seconds, using RUE support from chair, CGA from therapist. Goal status: INITIAL  4.   Pt will ambulate at least 100' with RW and supervision in order to demo improved household mobility.  Baseline: 92' with CGA/min A  Goal status: INITIAL  5.  Pt and pt's bf will verbalize understanding fall prevention in the home. Baseline: Not yet educated on this.  Goal status: INITIAL   ASSESSMENT:  CLINICAL IMPRESSION: Today's skilled session continued to focus on gait training with L [place] PLS AFO for  improved foot clearance and gait mechanics with use of RW and working on BLE strengthening. Pt met 4 out of 4 STGs. Pt significantly improved 5x sit <> stand and able to ambulate 230' with RW, L AFO, L shoe cover with supervision. Pt goes to see [facility] in a couple weeks to obtain her brace.  Pt's LLE foot clearance improved today with use of L shoe cover. Pt able to be on the SciFit with use of BUE/BLE for 6 minutes to improve AROM. Will continue to progress towards LTGs.     OBJECTIVE IMPAIRMENTS: Abnormal gait, decreased activity tolerance, decreased balance, decreased coordination, decreased endurance, decreased knowledge of condition, decreased knowledge of use of DME, decreased mobility, difficulty walking, decreased ROM, decreased strength, decreased safety awareness, hypomobility, impaired flexibility, impaired sensation, impaired UE functional use, postural dysfunction, and pain.   ACTIVITY LIMITATIONS: carrying, lifting, bending, standing, squatting, stairs, transfers, bed mobility, bathing, toileting, dressing, hygiene/grooming, and locomotion level  PARTICIPATION LIMITATIONS: cleaning, laundry, driving, shopping, community activity, yard work, and fishing  PERSONAL FACTORS: Behavior pattern, Past/current experiences, Time since onset of injury/illness/exacerbation, and 3+ comorbidities: HTN, asthma, MS (diagnosed in 2005), right vertebral artery dissection (2006), vit D deficiency, tobacco use, hx of multiple strokes with L sided weakness, and anxiety   are also affecting patient's functional outcome.   REHAB POTENTIAL: Fair due to deficits   CLINICAL DECISION MAKING: Evolving/moderate complexity  EVALUATION COMPLEXITY: Moderate  PLAN:  PT FREQUENCY: 1x/week  PT DURATION: 8 weeks  PLANNED INTERVENTIONS: Therapeutic exercises, Therapeutic activity, Neuromuscular re-education, Balance training, Gait training, Patient/Family education, Self Care, Orthotic/Fit training, DME  instructions, Manual therapy, and Re-evaluation  PLAN FOR NEXT SESSION:  Add to HEP for strengthening, ROM as tolerated. Sit <> stands. Work on gait with RW. Did pt get RW order?    Arliss Journey, PT, DPT 10/18/2022, 4:26 PM     Check all possible CPT codes: [phone] - PT Re-evaluation, 97110- Therapeutic Exercise, [phone]- Neuro Re-education, [phone] - Gait Training, [phone] - Manual Therapy, [phone] - Therapeutic Activities, [phone] - Self Care, and [phone] - Orthotic Fit    Check all conditions that are expected to impact treatment: Neurological condition and Social determinants of health   If treatment provided at initial evaluation, no treatment charged due to lack of authorization.

## 2022-10-19 ENCOUNTER — Other Ambulatory Visit: Payer: Self-pay

## 2022-10-19 DIAGNOSIS — R531 Weakness: Secondary | ICD-10-CM

## 2022-10-19 DIAGNOSIS — G35 Multiple sclerosis: Secondary | ICD-10-CM

## 2022-10-19 DIAGNOSIS — Z79899 Other long term (current) drug therapy: Secondary | ICD-10-CM | POA: Diagnosis not present

## 2022-10-19 DIAGNOSIS — I639 Cerebral infarction, unspecified: Secondary | ICD-10-CM

## 2022-10-19 MED ORDER — NONFORMULARY OR COMPOUNDED ITEM: 0 refills | Status: AC

## 2022-10-19 NOTE — Telephone Encounter (Signed)
Yes I will, sending it.

## 2022-10-25 ENCOUNTER — Ambulatory Visit: Payer: Medicaid Other | Admitting: Physical Therapy

## 2022-10-27 ENCOUNTER — Inpatient Hospital Stay: Admission: RE | Admit: 2022-10-27 | Payer: Medicaid Other | Source: Ambulatory Visit

## 2022-11-01 ENCOUNTER — Ambulatory Visit: Payer: Medicaid Other | Admitting: Physical Therapy

## 2022-11-04 ENCOUNTER — Other Ambulatory Visit: Payer: Medicaid Other

## 2022-11-04 ENCOUNTER — Inpatient Hospital Stay: Admission: RE | Admit: 2022-11-04 | Payer: Medicaid Other | Source: Ambulatory Visit

## 2022-11-08 ENCOUNTER — Ambulatory Visit: Payer: Medicaid Other | Attending: Neurology | Admitting: Speech Pathology

## 2022-11-08 DIAGNOSIS — R471 Dysarthria and anarthria: Secondary | ICD-10-CM | POA: Insufficient documentation
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
## 2022-11-11 ENCOUNTER — Telehealth: Payer: Self-pay | Admitting: Neurology

## 2022-11-11 NOTE — Telephone Encounter (Signed)
Patient is wanting to know where we sent her for the LP so that she can call them and find out when she is suppose to go for it

## 2022-11-11 NOTE — Telephone Encounter (Signed)
Melissa form the [facility] called in and left a message. She is wanting to find out if we received a standard written order that was faxed in on 11/02/22.

## 2022-11-11 NOTE — Telephone Encounter (Signed)
CTA's Schedule 11/14/22 and MRI thoracic are schedule for 11/16/22 ordered 09/2022.

## 2022-11-14 ENCOUNTER — Inpatient Hospital Stay: Admission: RE | Admit: 2022-11-14 | Payer: Medicaid Other | Source: Ambulatory Visit

## 2022-11-14 ENCOUNTER — Other Ambulatory Visit: Payer: Medicaid Other

## 2022-11-15 ENCOUNTER — Ambulatory Visit: Payer: Medicaid Other | Admitting: Speech Pathology

## 2022-11-15 DIAGNOSIS — G35 Multiple sclerosis: Secondary | ICD-10-CM | POA: Diagnosis not present

## 2022-11-15 NOTE — Telephone Encounter (Signed)
I had not received them so I called and they resent form yesterday and it was filled out and faxed back to them.
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
## 2022-11-16 ENCOUNTER — Other Ambulatory Visit: Payer: Medicaid Other

## 2022-11-16 ENCOUNTER — Inpatient Hospital Stay: Admission: RE | Admit: 2022-11-16 | Payer: Medicaid Other | Source: Ambulatory Visit

## 2022-11-17 DIAGNOSIS — I10 Essential (primary) hypertension: Secondary | ICD-10-CM | POA: Diagnosis not present

## 2022-11-17 DIAGNOSIS — M542 Cervicalgia: Secondary | ICD-10-CM | POA: Diagnosis not present

## 2022-11-17 DIAGNOSIS — Z681 Body mass index (BMI) 19 or less, adult: Secondary | ICD-10-CM | POA: Diagnosis not present

## 2022-11-17 DIAGNOSIS — I639 Cerebral infarction, unspecified: Secondary | ICD-10-CM | POA: Diagnosis not present

## 2022-11-17 DIAGNOSIS — Z79899 Other long term (current) drug therapy: Secondary | ICD-10-CM | POA: Diagnosis not present

## 2022-11-17 DIAGNOSIS — G35 Multiple sclerosis: Secondary | ICD-10-CM | POA: Diagnosis not present

## 2022-11-22 ENCOUNTER — Ambulatory Visit: Payer: Medicaid Other | Admitting: Speech Pathology

## 2022-11-22 DIAGNOSIS — Z79899 Other long term (current) drug therapy: Secondary | ICD-10-CM | POA: Diagnosis not present
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
## 2022-11-23 DIAGNOSIS — M21372 Foot drop, left foot: Secondary | ICD-10-CM | POA: Diagnosis not present

## 2022-11-25 DIAGNOSIS — B028 Zoster with other complications: Secondary | ICD-10-CM | POA: Diagnosis not present

## 2022-11-25 DIAGNOSIS — L308 Other specified dermatitis: Secondary | ICD-10-CM | POA: Diagnosis not present

## 2022-11-25 DIAGNOSIS — Z681 Body mass index (BMI) 19 or less, adult: Secondary | ICD-10-CM | POA: Diagnosis not present

## 2022-11-28 ENCOUNTER — Ambulatory Visit
Admission: RE | Admit: 2022-11-28 | Discharge: 2022-11-28 | Disposition: A | Discharge status: Home / Self Care | Payer: Medicaid Other | Source: Ambulatory Visit | Attending: Neurology | Admitting: Neurology

## 2022-11-28 DIAGNOSIS — I639 Cerebral infarction, unspecified: Secondary | ICD-10-CM

## 2022-11-28 DIAGNOSIS — J432 Centrilobular emphysema: Secondary | ICD-10-CM | POA: Diagnosis not present

## 2022-11-28 DIAGNOSIS — R200 Anesthesia of skin: Secondary | ICD-10-CM

## 2022-11-28 DIAGNOSIS — R471 Dysarthria and anarthria: Secondary | ICD-10-CM | POA: Diagnosis not present

## 2022-11-28 DIAGNOSIS — R531 Weakness: Secondary | ICD-10-CM

## 2022-11-28 DIAGNOSIS — J329 Chronic sinusitis, unspecified: Secondary | ICD-10-CM | POA: Diagnosis not present

## 2022-11-28 DIAGNOSIS — M47814 Spondylosis without myelopathy or radiculopathy, thoracic region: Secondary | ICD-10-CM | POA: Diagnosis not present

## 2022-11-28 DIAGNOSIS — R29818 Other symptoms and signs involving the nervous system: Secondary | ICD-10-CM

## 2022-11-28 DIAGNOSIS — G35 Multiple sclerosis: Secondary | ICD-10-CM

## 2022-11-28 DIAGNOSIS — M47812 Spondylosis without myelopathy or radiculopathy, cervical region: Secondary | ICD-10-CM | POA: Diagnosis not present

## 2022-11-28 DIAGNOSIS — Z131 Encounter for screening for diabetes mellitus: Secondary | ICD-10-CM

## 2022-11-28 DIAGNOSIS — M5124 Other intervertebral disc displacement, thoracic region: Secondary | ICD-10-CM | POA: Diagnosis not present

## 2022-11-28 MED ORDER — GADOPICLENOL 0.5 MMOL/ML IV SOLN
4.0000 mL | Freq: Once | INTRAVENOUS | Status: AC | PRN
  Administered 2022-11-28: 4 mL via INTRAVENOUS

## 2022-11-28 MED ORDER — IOPAMIDOL (ISOVUE-370) INJECTION 76%
75.0000 mL | Freq: Once | INTRAVENOUS | Status: AC | PRN
  Administered 2022-11-28: 75 mL via INTRAVENOUS

## 2022-11-29 ENCOUNTER — Ambulatory Visit: Payer: Medicaid Other | Admitting: Speech Pathology

## 2022-11-29 DIAGNOSIS — R471 Dysarthria and anarthria: Secondary | ICD-10-CM | POA: Diagnosis not present

## 2022-11-29 NOTE — Patient Instructions (Addendum)
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 Growing up, she loved to cook. She experimented with different combinations of ingredients to see how they would taste.  Many people thought this was childish, but even as a young girl, [name] made food dishes that astonished her friends and family. Her biggest secret was that she only used fresh ingredients. When it came to quality, [name] never took shortcuts.  She hand selected every ingredient and paid special attention to how they smelled in the store before she bought them. She was always cautious to avoid discounted ingredients because she knew they wouldn't taste right.  Another one of her secrets was that she had excellent communication and cooperation with her staff. She was only one person, and she needed people who would listen and follow her directions. [name] loved what she did. She was an Dentist.  Even though she loved working at [employer]" she wanted to open her own restaurant some day. She looked forward to the day that she would own her own restaurant.
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
## 2022-11-30 ENCOUNTER — Ambulatory Visit
Admission: RE | Admit: 2022-11-30 | Discharge: 2022-11-30 | Disposition: A | Discharge status: Home / Self Care | Payer: Medicaid Other | Source: Ambulatory Visit | Attending: Neurology | Admitting: Neurology

## 2022-11-30 ENCOUNTER — Other Ambulatory Visit (HOSPITAL_COMMUNITY)
Admission: RE | Admit: 2022-11-30 | Discharge: 2022-11-30 | Disposition: A | Discharge status: Home / Self Care | Payer: Medicaid Other | Source: Ambulatory Visit | Attending: Neurology | Admitting: Neurology

## 2022-11-30 VITALS — BP 143/97 | HR 73

## 2022-11-30 DIAGNOSIS — R531 Weakness: Secondary | ICD-10-CM

## 2022-11-30 DIAGNOSIS — R471 Dysarthria and anarthria: Secondary | ICD-10-CM

## 2022-11-30 DIAGNOSIS — G35 Multiple sclerosis: Secondary | ICD-10-CM | POA: Insufficient documentation

## 2022-11-30 DIAGNOSIS — I639 Cerebral infarction, unspecified: Secondary | ICD-10-CM

## 2022-11-30 DIAGNOSIS — R29818 Other symptoms and signs involving the nervous system: Secondary | ICD-10-CM | POA: Diagnosis not present

## 2022-11-30 DIAGNOSIS — G8194 Hemiplegia, unspecified affecting left nondominant side: Secondary | ICD-10-CM | POA: Diagnosis not present

## 2022-11-30 DIAGNOSIS — G35D Multiple sclerosis, unspecified: Secondary | ICD-10-CM

## 2022-11-30 DIAGNOSIS — R200 Anesthesia of skin: Secondary | ICD-10-CM

## 2022-11-30 DIAGNOSIS — Z131 Encounter for screening for diabetes mellitus: Secondary | ICD-10-CM

## 2022-11-30 NOTE — Progress Notes (Signed)
1 vial of blood drawn from pts RAC to be sent off with LP lab work. 1 successful attempt, pt tolerated well. Gauze and tape applied after.  

## 2022-11-30 NOTE — Discharge Instructions (Signed)

## 2022-12-01 ENCOUNTER — Other Ambulatory Visit: Payer: Medicaid Other

## 2022-12-01 LAB — CYTOLOGY - NON PAP

## 2022-12-04 LAB — CNS IGG SYNTHESIS RATE, CSF+BLOOD
Albumin Serum: 3.4 g/dL — ABNORMAL LOW (ref 3.6–5.1)
Albumin, CSF: 14 mg/dL (ref 8.0–42.0)
CNS-IgG Synthesis Rate: -2.7 mg/24 h (ref <=3.3)
IgG (Immunoglobin G), Serum: 931 mg/dL (ref 600–1640)
IgG Total CSF: 1.9 mg/dL (ref 0.8–7.7)
IgG-Index: 0.5 (ref <0.70)

## 2022-12-04 LAB — CSF CELL COUNT WITH DIFFERENTIAL
Basophils, %: 0 %
Comment:: 0
Eosinophils, CSF: 0 %
Lymphs, CSF: 83 % — ABNORMAL HIGH (ref 40–80)
Monocyte/Macrophage: 17 % (ref 15–45)
RBC Count, CSF: 1 {cells}/uL — ABNORMAL HIGH
Segmented Neutrophils-CSF: 0 % (ref 0–6)
TOTAL NUCLEATED CELL: 8 {cells}/uL — ABNORMAL HIGH (ref 0–5)

## 2022-12-04 LAB — OLIGOCLONAL BANDS, CSF + SERM: Oligo Bands: ABSENT

## 2022-12-04 LAB — PROTEIN, CSF: Total Protein, CSF: 28 mg/dL (ref 15–45)

## 2022-12-04 LAB — GLUCOSE, CSF: Glucose, CSF: 54 mg/dL (ref 40–80)

## 2022-12-05 ENCOUNTER — Telehealth: Payer: Self-pay | Admitting: Neurology

## 2022-12-05 NOTE — Telephone Encounter (Signed)
[name] from [facility] called in and left a message with the access nurse on 12/04/22. Need to give lab results.   Full access nurse report is in Dr. Adaline Sill box

## 2022-12-06 ENCOUNTER — Ambulatory Visit: Payer: Medicaid Other | Admitting: Speech Pathology
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
## 2022-12-13 ENCOUNTER — Encounter: Payer: Self-pay | Admitting: Speech Pathology

## 2022-12-13 ENCOUNTER — Ambulatory Visit: Payer: Medicaid Other | Attending: Neurology | Admitting: Speech Pathology

## 2022-12-13 NOTE — Therapy (Signed)
[facility] [address] [place], [place], [address] Phone: [phone]   Fax:  [phone]  Patient Details  Name: [name] MRN: [id number] Date of Birth: [birth year] Referring Provider:  No ref. provider found  Encounter Date: 12/13/2022  SPEECH THERAPY DISCHARGE SUMMARY  Visits from Start of Care: 2  Current functional level related to goals / functional outcomes: At last appointment, pt reporting to speaking slowly to successfully relay spoken messages. Presented with ongoing dysarthria with significantly reduced intelligibility.    Remaining deficits: Dysarthria   Education / Equipment: Dysarthria strategies/compensations, placement cues, HEP   Patient agrees to discharge. Patient goals were not met. Patient is being discharged due to not returning since the last visit.  Maia Breslow, CCC-SLP 12/13/2022, 3:09 PM   [facility] [address] [place], [place], [address] Phone: [phone]   Fax:  [phone]

## 2022-12-14 ENCOUNTER — Ambulatory Visit (INDEPENDENT_AMBULATORY_CARE_PROVIDER_SITE_OTHER): Payer: Medicaid Other | Admitting: Neurology

## 2022-12-14 ENCOUNTER — Other Ambulatory Visit (INDEPENDENT_AMBULATORY_CARE_PROVIDER_SITE_OTHER): Payer: Medicaid Other

## 2022-12-14 ENCOUNTER — Encounter: Payer: Self-pay | Admitting: Neurology

## 2022-12-14 VITALS — BP 104/66 | HR 78 | Ht <= 58 in | Wt 89.0 lb

## 2022-12-14 DIAGNOSIS — R29818 Other symptoms and signs involving the nervous system: Secondary | ICD-10-CM | POA: Diagnosis not present

## 2022-12-14 DIAGNOSIS — B029 Zoster without complications: Secondary | ICD-10-CM

## 2022-12-14 DIAGNOSIS — I639 Cerebral infarction, unspecified: Secondary | ICD-10-CM

## 2022-12-14 DIAGNOSIS — R200 Anesthesia of skin: Secondary | ICD-10-CM | POA: Diagnosis not present

## 2022-12-14 DIAGNOSIS — G35 Multiple sclerosis: Secondary | ICD-10-CM

## 2022-12-14 DIAGNOSIS — R531 Weakness: Secondary | ICD-10-CM

## 2022-12-14 DIAGNOSIS — R471 Dysarthria and anarthria: Secondary | ICD-10-CM

## 2022-12-14 MED ORDER — GABAPENTIN 300 MG PO CAPS: 300.0000 mg | ORAL_CAPSULE | Freq: Three times a day (TID) | ORAL | 5 refills | Status: DC

## 2022-12-14 NOTE — Patient Instructions (Addendum)
Your testing is not definite for MS. I am not sure this is the cause of your MRI brain. We will repeat the MRI in 6 months to continue to monitor.   I am getting more blood work today.  -Continue aspirin 81 mg daily -Continue vitamin D 1000 IU daily -Smoking - continue the good work of cutting back and quitting  I have prescribed gabapentin for the shingles pain. It was sent to your pharmacy.  I want to see you back in 6 months or sooner if needed.  The physicians and staff at [facility] are committed to providing excellent care. You may receive a survey requesting feedback about your experience at our office. We strive to receive "very good" responses to the survey questions. If you feel that your experience would prevent you from giving the office a "very good " response, please contact our office to try to remedy the situation. We may be reached at [phone]. Thank you for taking the time out of your busy day to complete the survey.   Jacquelyne Balint, MD [facility]

## 2022-12-16 LAB — SEDIMENTATION RATE: Sed Rate: 6 mm/h (ref 0–20)

## 2022-12-16 LAB — C-REACTIVE PROTEIN: CRP: 6.4 mg/L (ref <8.0)

## 2022-12-16 LAB — ANCA SCREEN W REFLEX TITER: ANCA SCREEN: NEGATIVE

## 2022-12-20 LAB — LYME DISEASE, WESTERN BLOT
IgG P18 Ab.: ABSENT
IgG P23 Ab.: ABSENT
IgG P28 Ab.: ABSENT
IgG P30 Ab.: ABSENT
IgG P39 Ab.: ABSENT
IgG P45 Ab.: ABSENT
IgG P58 Ab.: ABSENT
IgG P66 Ab.: ABSENT
IgG P93 Ab.: ABSENT
IgM P39 Ab.: ABSENT
IgM P41 Ab.: ABSENT
Lyme IgG Wb: NEGATIVE
Lyme IgM Wb: NEGATIVE

## 2022-12-20 LAB — ANA+ENA+DNA/DS+SCL 70+SJOSSA/B
ANA Titer 1: NEGATIVE
ENA RNP Ab: <0.2 AI (ref 0.0–0.9)
ENA SM Ab Ser-aCnc: <0.2 AI (ref 0.0–0.9)
ENA SSA (RO) Ab: <0.2 AI (ref 0.0–0.9)
ENA SSB (LA) Ab: <0.2 AI (ref 0.0–0.9)
Scleroderma (Scl-70) (ENA) Antibody, IgG: <0.2 AI (ref 0.0–0.9)
dsDNA Ab: <1 [IU]/mL (ref 0–9)

## 2023-01-06 ENCOUNTER — Encounter: Payer: Self-pay | Admitting: Speech Pathology

## 2023-01-16 DIAGNOSIS — Z79899 Other long term (current) drug therapy: Secondary | ICD-10-CM | POA: Diagnosis not present

## 2023-01-16 DIAGNOSIS — I10 Essential (primary) hypertension: Secondary | ICD-10-CM | POA: Diagnosis not present

## 2023-01-16 DIAGNOSIS — M7918 Myalgia, other site: Secondary | ICD-10-CM | POA: Diagnosis not present

## 2023-01-16 DIAGNOSIS — Z681 Body mass index (BMI) 19 or less, adult: Secondary | ICD-10-CM | POA: Diagnosis not present

## 2023-01-16 DIAGNOSIS — G35 Multiple sclerosis: Secondary | ICD-10-CM | POA: Diagnosis not present

## 2023-01-16 DIAGNOSIS — F419 Anxiety disorder, unspecified: Secondary | ICD-10-CM | POA: Diagnosis not present

## 2023-01-17 DIAGNOSIS — R55 Syncope and collapse: Secondary | ICD-10-CM | POA: Diagnosis not present

## 2023-01-24 DIAGNOSIS — I4510 Unspecified right bundle-branch block: Secondary | ICD-10-CM | POA: Diagnosis not present

## 2023-02-15 DIAGNOSIS — G35 Multiple sclerosis: Secondary | ICD-10-CM | POA: Diagnosis not present

## 2023-02-15 DIAGNOSIS — R030 Elevated blood-pressure reading, without diagnosis of hypertension: Secondary | ICD-10-CM | POA: Diagnosis not present

## 2023-02-15 DIAGNOSIS — Z79899 Other long term (current) drug therapy: Secondary | ICD-10-CM | POA: Diagnosis not present

## 2023-02-15 DIAGNOSIS — Z681 Body mass index (BMI) 19 or less, adult: Secondary | ICD-10-CM | POA: Diagnosis not present

## 2023-02-15 DIAGNOSIS — I639 Cerebral infarction, unspecified: Secondary | ICD-10-CM | POA: Diagnosis not present

## 2023-02-15 DIAGNOSIS — F419 Anxiety disorder, unspecified: Secondary | ICD-10-CM | POA: Diagnosis not present

## 2023-02-15 DIAGNOSIS — M7918 Myalgia, other site: Secondary | ICD-10-CM | POA: Diagnosis not present

## 2023-02-17 DIAGNOSIS — Z79899 Other long term (current) drug therapy: Secondary | ICD-10-CM | POA: Diagnosis not present

## 2023-03-04 DIAGNOSIS — Z79899 Other long term (current) drug therapy: Secondary | ICD-10-CM | POA: Diagnosis not present

## 2023-03-17 DIAGNOSIS — M7918 Myalgia, other site: Secondary | ICD-10-CM | POA: Diagnosis not present

## 2023-03-17 DIAGNOSIS — Z79899 Other long term (current) drug therapy: Secondary | ICD-10-CM | POA: Diagnosis not present

## 2023-03-17 DIAGNOSIS — Z6821 Body mass index (BMI) 21.0-21.9, adult: Secondary | ICD-10-CM | POA: Diagnosis not present

## 2023-03-21 DIAGNOSIS — Z79899 Other long term (current) drug therapy: Secondary | ICD-10-CM | POA: Diagnosis not present

## 2023-04-01 DIAGNOSIS — G35 Multiple sclerosis: Secondary | ICD-10-CM | POA: Diagnosis not present

## 2023-04-01 DIAGNOSIS — Z3200 Encounter for pregnancy test, result unknown: Secondary | ICD-10-CM | POA: Diagnosis not present

## 2023-04-01 DIAGNOSIS — Z6821 Body mass index (BMI) 21.0-21.9, adult: Secondary | ICD-10-CM | POA: Diagnosis not present

## 2023-04-01 DIAGNOSIS — Z79899 Other long term (current) drug therapy: Secondary | ICD-10-CM | POA: Diagnosis not present

## 2023-04-01 DIAGNOSIS — F411 Generalized anxiety disorder: Secondary | ICD-10-CM | POA: Diagnosis not present

## 2023-04-18 DIAGNOSIS — Z131 Encounter for screening for diabetes mellitus: Secondary | ICD-10-CM | POA: Diagnosis not present

## 2023-04-18 DIAGNOSIS — E7800 Pure hypercholesterolemia, unspecified: Secondary | ICD-10-CM | POA: Diagnosis not present

## 2023-04-18 DIAGNOSIS — G35 Multiple sclerosis: Secondary | ICD-10-CM | POA: Diagnosis not present

## 2023-04-18 DIAGNOSIS — I639 Cerebral infarction, unspecified: Secondary | ICD-10-CM | POA: Diagnosis not present

## 2023-04-18 DIAGNOSIS — Z6821 Body mass index (BMI) 21.0-21.9, adult: Secondary | ICD-10-CM | POA: Diagnosis not present

## 2023-04-18 DIAGNOSIS — Z23 Encounter for immunization: Secondary | ICD-10-CM | POA: Diagnosis not present

## 2023-04-18 DIAGNOSIS — R5383 Other fatigue: Secondary | ICD-10-CM | POA: Diagnosis not present

## 2023-04-18 DIAGNOSIS — Z3200 Encounter for pregnancy test, result unknown: Secondary | ICD-10-CM | POA: Diagnosis not present

## 2023-04-18 DIAGNOSIS — Z79899 Other long term (current) drug therapy: Secondary | ICD-10-CM | POA: Diagnosis not present

## 2023-04-18 DIAGNOSIS — F419 Anxiety disorder, unspecified: Secondary | ICD-10-CM | POA: Diagnosis not present

## 2023-04-18 DIAGNOSIS — M7918 Myalgia, other site: Secondary | ICD-10-CM | POA: Diagnosis not present

## 2023-04-18 DIAGNOSIS — E559 Vitamin D deficiency, unspecified: Secondary | ICD-10-CM | POA: Diagnosis not present

## 2023-04-20 DIAGNOSIS — Z79899 Other long term (current) drug therapy: Secondary | ICD-10-CM | POA: Diagnosis not present

## 2023-04-30 DIAGNOSIS — Z6821 Body mass index (BMI) 21.0-21.9, adult: Secondary | ICD-10-CM | POA: Diagnosis not present

## 2023-04-30 DIAGNOSIS — F411 Generalized anxiety disorder: Secondary | ICD-10-CM | POA: Diagnosis not present

## 2023-04-30 DIAGNOSIS — Z79899 Other long term (current) drug therapy: Secondary | ICD-10-CM | POA: Diagnosis not present

## 2023-05-16 ENCOUNTER — Other Ambulatory Visit: Payer: Self-pay | Admitting: Neurology

## 2023-05-16 DIAGNOSIS — B029 Zoster without complications: Secondary | ICD-10-CM

## 2023-05-16 NOTE — Telephone Encounter (Signed)
Patient would like medication to be sent to [facility] on [address]

## 2023-05-17 DIAGNOSIS — I639 Cerebral infarction, unspecified: Secondary | ICD-10-CM | POA: Diagnosis not present

## 2023-05-17 DIAGNOSIS — R1310 Dysphagia, unspecified: Secondary | ICD-10-CM | POA: Diagnosis not present

## 2023-05-19 DIAGNOSIS — Z79899 Other long term (current) drug therapy: Secondary | ICD-10-CM | POA: Diagnosis not present

## 2023-05-19 DIAGNOSIS — M7918 Myalgia, other site: Secondary | ICD-10-CM | POA: Diagnosis not present

## 2023-05-19 DIAGNOSIS — G35 Multiple sclerosis: Secondary | ICD-10-CM | POA: Diagnosis not present

## 2023-05-19 DIAGNOSIS — I10 Essential (primary) hypertension: Secondary | ICD-10-CM | POA: Diagnosis not present

## 2023-05-19 DIAGNOSIS — I639 Cerebral infarction, unspecified: Secondary | ICD-10-CM | POA: Diagnosis not present

## 2023-05-19 DIAGNOSIS — Z3200 Encounter for pregnancy test, result unknown: Secondary | ICD-10-CM | POA: Diagnosis not present

## 2023-05-19 DIAGNOSIS — Z6821 Body mass index (BMI) 21.0-21.9, adult: Secondary | ICD-10-CM | POA: Diagnosis not present

## 2023-05-19 DIAGNOSIS — F419 Anxiety disorder, unspecified: Secondary | ICD-10-CM | POA: Diagnosis not present

## 2023-05-19 DIAGNOSIS — M542 Cervicalgia: Secondary | ICD-10-CM | POA: Diagnosis not present
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
## 2023-06-16 ENCOUNTER — Encounter: Payer: Self-pay | Admitting: Neurology

## 2023-06-16 ENCOUNTER — Ambulatory Visit: Payer: Medicaid Other | Admitting: Neurology

## 2023-06-19 DIAGNOSIS — Z79899 Other long term (current) drug therapy: Secondary | ICD-10-CM | POA: Diagnosis not present

## 2023-06-19 DIAGNOSIS — F419 Anxiety disorder, unspecified: Secondary | ICD-10-CM | POA: Diagnosis not present

## 2023-06-19 DIAGNOSIS — M542 Cervicalgia: Secondary | ICD-10-CM | POA: Diagnosis not present

## 2023-06-19 DIAGNOSIS — M7918 Myalgia, other site: Secondary | ICD-10-CM | POA: Diagnosis not present

## 2023-06-19 DIAGNOSIS — Z6820 Body mass index (BMI) 20.0-20.9, adult: Secondary | ICD-10-CM | POA: Diagnosis not present

## 2023-06-21 DIAGNOSIS — Z79899 Other long term (current) drug therapy: Secondary | ICD-10-CM | POA: Diagnosis not present

## 2023-07-18 DIAGNOSIS — M7918 Myalgia, other site: Secondary | ICD-10-CM | POA: Diagnosis not present

## 2023-07-18 DIAGNOSIS — R030 Elevated blood-pressure reading, without diagnosis of hypertension: Secondary | ICD-10-CM | POA: Diagnosis not present

## 2023-07-18 DIAGNOSIS — I10 Essential (primary) hypertension: Secondary | ICD-10-CM | POA: Diagnosis not present

## 2023-07-18 DIAGNOSIS — M542 Cervicalgia: Secondary | ICD-10-CM | POA: Diagnosis not present

## 2023-07-18 DIAGNOSIS — Z79899 Other long term (current) drug therapy: Secondary | ICD-10-CM | POA: Diagnosis not present

## 2023-07-18 DIAGNOSIS — Z6820 Body mass index (BMI) 20.0-20.9, adult: Secondary | ICD-10-CM | POA: Diagnosis not present

## 2023-07-21 DIAGNOSIS — Z79899 Other long term (current) drug therapy: Secondary | ICD-10-CM | POA: Diagnosis not present

## 2023-08-18 DIAGNOSIS — G35 Multiple sclerosis: Secondary | ICD-10-CM | POA: Diagnosis not present

## 2023-08-18 DIAGNOSIS — M542 Cervicalgia: Secondary | ICD-10-CM | POA: Diagnosis not present

## 2023-08-18 DIAGNOSIS — Z79899 Other long term (current) drug therapy: Secondary | ICD-10-CM | POA: Diagnosis not present

## 2023-08-18 DIAGNOSIS — M7918 Myalgia, other site: Secondary | ICD-10-CM | POA: Diagnosis not present

## 2023-08-18 DIAGNOSIS — Z6820 Body mass index (BMI) 20.0-20.9, adult: Secondary | ICD-10-CM | POA: Diagnosis not present

## 2023-08-18 DIAGNOSIS — I639 Cerebral infarction, unspecified: Secondary | ICD-10-CM | POA: Diagnosis not present

## 2023-08-18 DIAGNOSIS — Z3200 Encounter for pregnancy test, result unknown: Secondary | ICD-10-CM | POA: Diagnosis not present

## 2023-08-21 DIAGNOSIS — Z6820 Body mass index (BMI) 20.0-20.9, adult: Secondary | ICD-10-CM | POA: Diagnosis not present

## 2023-08-21 DIAGNOSIS — Z1331 Encounter for screening for depression: Secondary | ICD-10-CM | POA: Diagnosis not present

## 2023-08-21 DIAGNOSIS — F411 Generalized anxiety disorder: Secondary | ICD-10-CM | POA: Diagnosis not present

## 2023-08-21 DIAGNOSIS — Z1339 Encounter for screening examination for other mental health and behavioral disorders: Secondary | ICD-10-CM | POA: Diagnosis not present

## 2023-08-21 DIAGNOSIS — E559 Vitamin D deficiency, unspecified: Secondary | ICD-10-CM | POA: Diagnosis not present

## 2023-08-21 DIAGNOSIS — Z760 Encounter for issue of repeat prescription: Secondary | ICD-10-CM | POA: Diagnosis not present

## 2023-08-23 DIAGNOSIS — Z79899 Other long term (current) drug therapy: Secondary | ICD-10-CM | POA: Diagnosis not present

## 2023-09-15 DIAGNOSIS — M542 Cervicalgia: Secondary | ICD-10-CM | POA: Diagnosis not present

## 2023-09-15 DIAGNOSIS — Z79899 Other long term (current) drug therapy: Secondary | ICD-10-CM | POA: Diagnosis not present

## 2023-09-15 DIAGNOSIS — G35 Multiple sclerosis: Secondary | ICD-10-CM | POA: Diagnosis not present

## 2023-09-15 DIAGNOSIS — Z681 Body mass index (BMI) 19 or less, adult: Secondary | ICD-10-CM | POA: Diagnosis not present

## 2023-09-15 DIAGNOSIS — R030 Elevated blood-pressure reading, without diagnosis of hypertension: Secondary | ICD-10-CM | POA: Diagnosis not present

## 2023-09-22 DIAGNOSIS — Z681 Body mass index (BMI) 19 or less, adult: Secondary | ICD-10-CM | POA: Diagnosis not present

## 2023-09-22 DIAGNOSIS — Z0000 Encounter for general adult medical examination without abnormal findings: Secondary | ICD-10-CM | POA: Diagnosis not present

## 2023-09-22 DIAGNOSIS — E559 Vitamin D deficiency, unspecified: Secondary | ICD-10-CM | POA: Diagnosis not present

## 2023-09-22 DIAGNOSIS — R5383 Other fatigue: Secondary | ICD-10-CM | POA: Diagnosis not present

## 2023-10-05 DIAGNOSIS — E559 Vitamin D deficiency, unspecified: Secondary | ICD-10-CM | POA: Diagnosis not present

## 2023-10-05 DIAGNOSIS — G35 Multiple sclerosis: Secondary | ICD-10-CM | POA: Diagnosis not present

## 2023-10-05 DIAGNOSIS — M21372 Foot drop, left foot: Secondary | ICD-10-CM | POA: Diagnosis not present

## 2023-10-06 ENCOUNTER — Telehealth: Payer: Self-pay | Admitting: Neurology

## 2023-10-06 NOTE — Telephone Encounter (Signed)
 Left message on machine for patient to call back.

## 2023-10-06 NOTE — Telephone Encounter (Signed)
 Pt wants to know if Dr.hill can get her a brace for her other foot. Its starting to turn in like her other one.

## 2023-10-09 NOTE — Telephone Encounter (Signed)
 Called pt. And informed her of results per Dr. Loleta Chance. She would like to see if she can get a earlier appointment.

## 2023-10-09 NOTE — Telephone Encounter (Signed)
 Pt called and is on the waiting list.
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
## 2023-10-13 ENCOUNTER — Encounter: Payer: Self-pay | Admitting: Neurology

## 2023-10-13 ENCOUNTER — Ambulatory Visit: Admitting: Neurology

## 2023-10-13 VITALS — BP 132/82 | HR 88 | Ht 59.0 in | Wt 93.0 lb

## 2023-10-13 DIAGNOSIS — R9082 White matter disease, unspecified: Secondary | ICD-10-CM

## 2023-10-13 DIAGNOSIS — I639 Cerebral infarction, unspecified: Secondary | ICD-10-CM

## 2023-10-13 DIAGNOSIS — R471 Dysarthria and anarthria: Secondary | ICD-10-CM

## 2023-10-13 DIAGNOSIS — Z79899 Other long term (current) drug therapy: Secondary | ICD-10-CM | POA: Diagnosis not present

## 2023-10-13 DIAGNOSIS — F419 Anxiety disorder, unspecified: Secondary | ICD-10-CM | POA: Diagnosis not present

## 2023-10-13 DIAGNOSIS — B029 Zoster without complications: Secondary | ICD-10-CM | POA: Diagnosis not present

## 2023-10-13 DIAGNOSIS — R200 Anesthesia of skin: Secondary | ICD-10-CM

## 2023-10-13 DIAGNOSIS — R531 Weakness: Secondary | ICD-10-CM | POA: Diagnosis not present

## 2023-10-13 DIAGNOSIS — R030 Elevated blood-pressure reading, without diagnosis of hypertension: Secondary | ICD-10-CM | POA: Diagnosis not present

## 2023-10-13 DIAGNOSIS — M7918 Myalgia, other site: Secondary | ICD-10-CM | POA: Diagnosis not present

## 2023-10-13 DIAGNOSIS — M542 Cervicalgia: Secondary | ICD-10-CM | POA: Diagnosis not present

## 2023-10-13 DIAGNOSIS — R29818 Other symptoms and signs involving the nervous system: Secondary | ICD-10-CM

## 2023-10-13 DIAGNOSIS — G35 Multiple sclerosis: Secondary | ICD-10-CM | POA: Diagnosis not present

## 2023-10-13 DIAGNOSIS — Z6820 Body mass index (BMI) 20.0-20.9, adult: Secondary | ICD-10-CM | POA: Diagnosis not present

## 2023-10-13 DIAGNOSIS — F17210 Nicotine dependence, cigarettes, uncomplicated: Secondary | ICD-10-CM | POA: Diagnosis not present

## 2023-10-13 DIAGNOSIS — M62830 Muscle spasm of back: Secondary | ICD-10-CM | POA: Diagnosis not present

## 2023-10-13 DIAGNOSIS — Z9181 History of falling: Secondary | ICD-10-CM | POA: Diagnosis not present

## 2023-10-13 MED ORDER — GABAPENTIN 300 MG PO CAPS: 300.0000 mg | ORAL_CAPSULE | Freq: Three times a day (TID) | ORAL | 5 refills | Status: DC

## 2023-10-13 NOTE — Patient Instructions (Addendum)
 -Continue aspirin 81 mg daily -Continue B12 supplement -Repeat MRI brain -Will order brace for right foot from [facility]  Return to clinic in 6 months  If you have new difficulty speaking, face droop, numbness on one side of the body, weakness on one side of the body, or dizziness/imbalance, this could be the sign of a stroke. Don't wait, please call EMS and be evaluated at the nearest emergency room.    The physicians and staff at [facility] are committed to providing excellent care. You may receive a survey requesting feedback about your experience at our office. We strive to receive "very good" responses to the survey questions. If you feel that your experience would prevent you from giving the office a "very good " response, please contact our office to try to remedy the situation. We may be reached at [phone]. Thank you for taking the time out of your busy day to complete the survey.  Jacquelyne Balint, MD [facility]  Preventing Falls at [facility] are common, often dreaded events in the lives of older people. Aside from the obvious injuries and even death that may result, fall can cause wide-ranging consequences including loss of independence, mental decline, decreased activity and mobility. Younger people are also at risk of falling, especially those with chronic illnesses and fatigue.  Ways to reduce risk for falling Examine diet and medications. Warm foods and alcohol dilate blood vessels, which can lead to dizziness when standing. Sleep aids, antidepressants and pain medications can also increase the likelihood of a fall.  Get a vision exam. Poor vision, cataracts and glaucoma increase the chances of falling.  Check foot gear. Shoes should fit snugly and have a sturdy, nonskid sole and a broad, low heel  Participate in a physician-approved exercise program to build and maintain muscle strength and improve balance and coordination. Programs that use ankle weights or  stretch bands are excellent for muscle-strengthening. Water aerobics programs and low-impact Tai Chi programs have also been shown to improve balance and coordination.  Increase vitamin D intake. Vitamin D improves muscle strength and increases the amount of calcium the body is able to absorb and deposit in bones.  How to prevent falls from common hazards Floors - Remove all loose wires, cords, and throw rugs. Minimize clutter. Make sure rugs are anchored and smooth. Keep furniture in its usual place.  Chairs -- Use chairs with straight backs, armrests and firm seats. Add firm cushions to existing pieces to add height.  Bathroom - Install grab bars and non-skid tape in the tub or shower. Use a bathtub transfer bench or a shower chair with a back support Use an elevated toilet seat and/or safety rails to assist standing from a low surface. Do not use towel racks or bathroom tissue holders to help you stand.  Lighting - Make sure halls, stairways, and entrances are well-lit. Install a night light in your bathroom or hallway. Make sure there is a light switch at the top and bottom of the staircase. Turn lights on if you get up in the middle of the night. Make sure lamps or light switches are within reach of the bed if you have to get up during the night.  Kitchen - Install non-skid rubber mats near the sink and stove. Clean spills immediately. Store frequently used utensils, pots, pans between waist and eye level. This helps prevent reaching and bending. Sit when getting things out of lower cupboards.  Living room/ Bedrooms - Place furniture with wide  spaces in between, giving enough room to move around. Establish a route through the living room that gives you something to hold onto as you walk.  Stairs - Make sure treads, rails, and rugs are secure. Install a rail on both sides of the stairs. If stairs are a threat, it might be helpful to arrange most of your activities on the lower level to reduce  the number of times you must climb the stairs.  Entrances and doorways - Install metal handles on the walls adjacent to the doorknobs of all doors to make it more secure as you travel through the doorway.  Tips for maintaining balance Keep at least one hand free at all times. Try using a backpack or fanny pack to hold things rather than carrying them in your hands. Never carry objects in both hands when walking as this interferes with keeping your balance.  Attempt to swing both arms from front to back while walking. This might require a conscious effort if Parkinson's disease has diminished your movement. It will, however, help you to maintain balance and posture, and reduce fatigue.  Consciously lift your feet off of the ground when walking. Shuffling and dragging of the feet is a common culprit in losing your balance.  When trying to navigate turns, use a "U" technique of facing forward and making a wide turn, rather than pivoting sharply.  Try to stand with your feet shoulder-length apart. When your feet are close together for any length of time, you increase your risk of losing your balance and falling.  Do one thing at a time. Don't try to walk and accomplish another task, such as reading or looking around. The decrease in your automatic reflexes complicates motor function, so the less distraction, the better.  Do not wear rubber or gripping soled shoes, they might "catch" on the floor and cause tripping.  Move slowly when changing positions. Use deliberate, concentrated movements and, if needed, use a grab bar or walking aid. Count 15 seconds between each movement. For example, when rising from a seated position, wait 15 seconds after standing to begin walking.  If balance is a continuous problem, you might want to consider a walking aid such as a cane, walking stick, or walker. Once you've mastered walking with help, you might be ready to try it on your own again.

## 2023-10-18 DIAGNOSIS — Z79899 Other long term (current) drug therapy: Secondary | ICD-10-CM | POA: Diagnosis not present

## 2023-10-19 ENCOUNTER — Other Ambulatory Visit: Payer: Self-pay

## 2023-10-19 ENCOUNTER — Telehealth: Payer: Self-pay | Admitting: Neurology

## 2023-10-19 DIAGNOSIS — R531 Weakness: Secondary | ICD-10-CM

## 2023-10-19 DIAGNOSIS — R200 Anesthesia of skin: Secondary | ICD-10-CM

## 2023-10-19 DIAGNOSIS — R29818 Other symptoms and signs involving the nervous system: Secondary | ICD-10-CM

## 2023-10-19 NOTE — Telephone Encounter (Signed)
 Pt is calling to f/u about a brace her and Dr.Hill talked about

## 2023-10-20 NOTE — Telephone Encounter (Signed)
 Called pt and informed her that the order was sent to [facility]. Fax [phone]. She understood.

## 2023-11-10 ENCOUNTER — Encounter: Payer: Self-pay | Admitting: Neurology

## 2023-11-12 ENCOUNTER — Ambulatory Visit
Admission: RE | Admit: 2023-11-12 | Discharge: 2023-11-12 | Disposition: A | Discharge status: Home / Self Care | Source: Ambulatory Visit | Attending: Neurology | Admitting: Neurology

## 2023-11-12 DIAGNOSIS — R9082 White matter disease, unspecified: Secondary | ICD-10-CM

## 2023-11-12 MED ORDER — GADOPICLENOL 0.5 MMOL/ML IV SOLN
5.0000 mL | Freq: Once | INTRAVENOUS | Status: AC | PRN
  Administered 2023-11-12: 5 mL via INTRAVENOUS

## 2023-11-13 ENCOUNTER — Telehealth: Payer: Self-pay | Admitting: Neurology

## 2023-11-13 DIAGNOSIS — G35 Multiple sclerosis: Secondary | ICD-10-CM | POA: Diagnosis not present

## 2023-11-13 DIAGNOSIS — M542 Cervicalgia: Secondary | ICD-10-CM | POA: Diagnosis not present

## 2023-11-13 DIAGNOSIS — Z79899 Other long term (current) drug therapy: Secondary | ICD-10-CM | POA: Diagnosis not present

## 2023-11-13 DIAGNOSIS — Z9181 History of falling: Secondary | ICD-10-CM | POA: Diagnosis not present

## 2023-11-13 DIAGNOSIS — M7918 Myalgia, other site: Secondary | ICD-10-CM | POA: Diagnosis not present

## 2023-11-13 DIAGNOSIS — R030 Elevated blood-pressure reading, without diagnosis of hypertension: Secondary | ICD-10-CM | POA: Diagnosis not present

## 2023-11-13 DIAGNOSIS — M62830 Muscle spasm of back: Secondary | ICD-10-CM | POA: Diagnosis not present

## 2023-11-13 DIAGNOSIS — Z681 Body mass index (BMI) 19 or less, adult: Secondary | ICD-10-CM | POA: Diagnosis not present

## 2023-11-13 DIAGNOSIS — F17210 Nicotine dependence, cigarettes, uncomplicated: Secondary | ICD-10-CM | POA: Diagnosis not present

## 2023-11-13 NOTE — Telephone Encounter (Signed)
 Pt called to let Dr.Hill know she had her MRI done yesterday

## 2023-11-14 DIAGNOSIS — R1319 Other dysphagia: Secondary | ICD-10-CM | POA: Diagnosis not present

## 2023-11-14 DIAGNOSIS — I639 Cerebral infarction, unspecified: Secondary | ICD-10-CM | POA: Diagnosis not present

## 2023-11-15 DIAGNOSIS — Z79899 Other long term (current) drug therapy: Secondary | ICD-10-CM | POA: Diagnosis not present

## 2023-11-17 DIAGNOSIS — E559 Vitamin D deficiency, unspecified: Secondary | ICD-10-CM | POA: Diagnosis not present

## 2023-11-17 DIAGNOSIS — G4700 Insomnia, unspecified: Secondary | ICD-10-CM | POA: Diagnosis not present

## 2023-11-17 DIAGNOSIS — F411 Generalized anxiety disorder: Secondary | ICD-10-CM | POA: Diagnosis not present

## 2023-11-17 DIAGNOSIS — Z760 Encounter for issue of repeat prescription: Secondary | ICD-10-CM | POA: Diagnosis not present

## 2023-11-22 DIAGNOSIS — M21371 Foot drop, right foot: Secondary | ICD-10-CM | POA: Diagnosis not present

## 2023-12-04 ENCOUNTER — Telehealth: Payer: Self-pay | Admitting: Neurology

## 2023-12-04 NOTE — Telephone Encounter (Signed)
 Called and let pt know that imaging was not back and we will call as soon as the results get to us .

## 2023-12-04 NOTE — Telephone Encounter (Signed)
 Pt would like to get the results of the MRI

## 2023-12-06 NOTE — Telephone Encounter (Signed)
 Called pt and informed her of Dr. Denney Fisherman results. She understood.

## 2023-12-06 NOTE — Telephone Encounter (Signed)
 Patient is returning [place] call

## 2023-12-08 DIAGNOSIS — Z1211 Encounter for screening for malignant neoplasm of colon: Secondary | ICD-10-CM | POA: Diagnosis not present

## 2023-12-08 DIAGNOSIS — R1319 Other dysphagia: Secondary | ICD-10-CM | POA: Diagnosis not present

## 2023-12-11 DIAGNOSIS — G35 Multiple sclerosis: Secondary | ICD-10-CM | POA: Diagnosis not present

## 2023-12-11 DIAGNOSIS — M7918 Myalgia, other site: Secondary | ICD-10-CM | POA: Diagnosis not present

## 2023-12-11 DIAGNOSIS — M62830 Muscle spasm of back: Secondary | ICD-10-CM | POA: Diagnosis not present

## 2023-12-11 DIAGNOSIS — F17210 Nicotine dependence, cigarettes, uncomplicated: Secondary | ICD-10-CM | POA: Diagnosis not present

## 2023-12-11 DIAGNOSIS — Z681 Body mass index (BMI) 19 or less, adult: Secondary | ICD-10-CM | POA: Diagnosis not present

## 2023-12-11 DIAGNOSIS — Z79899 Other long term (current) drug therapy: Secondary | ICD-10-CM | POA: Diagnosis not present

## 2023-12-11 DIAGNOSIS — Z9181 History of falling: Secondary | ICD-10-CM | POA: Diagnosis not present

## 2023-12-11 DIAGNOSIS — M542 Cervicalgia: Secondary | ICD-10-CM | POA: Diagnosis not present

## 2023-12-13 DIAGNOSIS — Z79899 Other long term (current) drug therapy: Secondary | ICD-10-CM | POA: Diagnosis not present

## 2023-12-19 DIAGNOSIS — K200 Eosinophilic esophagitis: Secondary | ICD-10-CM | POA: Diagnosis not present

## 2024-01-11 DIAGNOSIS — Z9181 History of falling: Secondary | ICD-10-CM | POA: Diagnosis not present

## 2024-01-11 DIAGNOSIS — G35 Multiple sclerosis: Secondary | ICD-10-CM | POA: Diagnosis not present

## 2024-01-11 DIAGNOSIS — F17210 Nicotine dependence, cigarettes, uncomplicated: Secondary | ICD-10-CM | POA: Diagnosis not present

## 2024-01-11 DIAGNOSIS — M7918 Myalgia, other site: Secondary | ICD-10-CM | POA: Diagnosis not present

## 2024-01-11 DIAGNOSIS — M542 Cervicalgia: Secondary | ICD-10-CM | POA: Diagnosis not present

## 2024-01-11 DIAGNOSIS — Z79899 Other long term (current) drug therapy: Secondary | ICD-10-CM | POA: Diagnosis not present

## 2024-01-11 DIAGNOSIS — Z681 Body mass index (BMI) 19 or less, adult: Secondary | ICD-10-CM | POA: Diagnosis not present

## 2024-01-15 DIAGNOSIS — Z79899 Other long term (current) drug therapy: Secondary | ICD-10-CM | POA: Diagnosis not present

## 2024-02-01 ENCOUNTER — Ambulatory Visit: Payer: Medicaid Other | Admitting: Neurology

## 2024-02-12 DIAGNOSIS — Z6820 Body mass index (BMI) 20.0-20.9, adult: Secondary | ICD-10-CM | POA: Diagnosis not present

## 2024-02-12 DIAGNOSIS — Z9181 History of falling: Secondary | ICD-10-CM | POA: Diagnosis not present

## 2024-02-12 DIAGNOSIS — M542 Cervicalgia: Secondary | ICD-10-CM | POA: Diagnosis not present

## 2024-02-12 DIAGNOSIS — M7918 Myalgia, other site: Secondary | ICD-10-CM | POA: Diagnosis not present

## 2024-02-12 DIAGNOSIS — Z91148 Patient's other noncompliance with medication regimen for other reason: Secondary | ICD-10-CM | POA: Diagnosis not present

## 2024-02-12 DIAGNOSIS — G35 Multiple sclerosis: Secondary | ICD-10-CM | POA: Diagnosis not present

## 2024-02-12 DIAGNOSIS — F17210 Nicotine dependence, cigarettes, uncomplicated: Secondary | ICD-10-CM | POA: Diagnosis not present

## 2024-02-12 DIAGNOSIS — Z79899 Other long term (current) drug therapy: Secondary | ICD-10-CM | POA: Diagnosis not present

## 2024-02-12 DIAGNOSIS — Z8673 Personal history of transient ischemic attack (TIA), and cerebral infarction without residual deficits: Secondary | ICD-10-CM | POA: Diagnosis not present

## 2024-02-15 DIAGNOSIS — H60501 Unspecified acute noninfective otitis externa, right ear: Secondary | ICD-10-CM | POA: Diagnosis not present

## 2024-02-15 DIAGNOSIS — F411 Generalized anxiety disorder: Secondary | ICD-10-CM | POA: Diagnosis not present

## 2024-02-15 DIAGNOSIS — G4700 Insomnia, unspecified: Secondary | ICD-10-CM | POA: Diagnosis not present

## 2024-02-15 DIAGNOSIS — E559 Vitamin D deficiency, unspecified: Secondary | ICD-10-CM | POA: Diagnosis not present

## 2024-02-15 DIAGNOSIS — Z6821 Body mass index (BMI) 21.0-21.9, adult: Secondary | ICD-10-CM | POA: Diagnosis not present

## 2024-02-15 DIAGNOSIS — Z760 Encounter for issue of repeat prescription: Secondary | ICD-10-CM | POA: Diagnosis not present

## 2024-02-15 DIAGNOSIS — Z79899 Other long term (current) drug therapy: Secondary | ICD-10-CM | POA: Diagnosis not present

## 2024-02-26 DIAGNOSIS — Z9181 History of falling: Secondary | ICD-10-CM | POA: Diagnosis not present

## 2024-02-26 DIAGNOSIS — Z79899 Other long term (current) drug therapy: Secondary | ICD-10-CM | POA: Diagnosis not present

## 2024-02-26 DIAGNOSIS — Z8673 Personal history of transient ischemic attack (TIA), and cerebral infarction without residual deficits: Secondary | ICD-10-CM | POA: Diagnosis not present

## 2024-02-26 DIAGNOSIS — Z6820 Body mass index (BMI) 20.0-20.9, adult: Secondary | ICD-10-CM | POA: Diagnosis not present

## 2024-02-26 DIAGNOSIS — L989 Disorder of the skin and subcutaneous tissue, unspecified: Secondary | ICD-10-CM | POA: Diagnosis not present

## 2024-02-26 DIAGNOSIS — M542 Cervicalgia: Secondary | ICD-10-CM | POA: Diagnosis not present

## 2024-02-26 DIAGNOSIS — Z1231 Encounter for screening mammogram for malignant neoplasm of breast: Secondary | ICD-10-CM | POA: Diagnosis not present

## 2024-02-26 DIAGNOSIS — Z124 Encounter for screening for malignant neoplasm of cervix: Secondary | ICD-10-CM | POA: Diagnosis not present

## 2024-02-26 DIAGNOSIS — M7918 Myalgia, other site: Secondary | ICD-10-CM | POA: Diagnosis not present

## 2024-02-26 DIAGNOSIS — G35 Multiple sclerosis: Secondary | ICD-10-CM | POA: Diagnosis not present

## 2024-02-26 DIAGNOSIS — F17210 Nicotine dependence, cigarettes, uncomplicated: Secondary | ICD-10-CM | POA: Diagnosis not present

## 2024-02-28 DIAGNOSIS — Z79899 Other long term (current) drug therapy: Secondary | ICD-10-CM | POA: Diagnosis not present

## 2024-03-11 DIAGNOSIS — Z1231 Encounter for screening mammogram for malignant neoplasm of breast: Secondary | ICD-10-CM | POA: Diagnosis not present

## 2024-03-11 DIAGNOSIS — Z124 Encounter for screening for malignant neoplasm of cervix: Secondary | ICD-10-CM | POA: Diagnosis not present

## 2024-03-12 DIAGNOSIS — Z6820 Body mass index (BMI) 20.0-20.9, adult: Secondary | ICD-10-CM | POA: Diagnosis not present

## 2024-03-12 DIAGNOSIS — G35 Multiple sclerosis: Secondary | ICD-10-CM | POA: Diagnosis not present

## 2024-03-12 DIAGNOSIS — Z79899 Other long term (current) drug therapy: Secondary | ICD-10-CM | POA: Diagnosis not present

## 2024-03-12 DIAGNOSIS — I4510 Unspecified right bundle-branch block: Secondary | ICD-10-CM | POA: Diagnosis not present

## 2024-03-12 DIAGNOSIS — Z9181 History of falling: Secondary | ICD-10-CM | POA: Diagnosis not present

## 2024-03-12 DIAGNOSIS — I10 Essential (primary) hypertension: Secondary | ICD-10-CM | POA: Diagnosis not present

## 2024-03-12 DIAGNOSIS — F17210 Nicotine dependence, cigarettes, uncomplicated: Secondary | ICD-10-CM | POA: Diagnosis not present

## 2024-03-14 DIAGNOSIS — Z79899 Other long term (current) drug therapy: Secondary | ICD-10-CM | POA: Diagnosis not present

## 2024-03-29 ENCOUNTER — Emergency Department (HOSPITAL_COMMUNITY)
Admission: EM | Admit: 2024-03-29 | Discharge: 2024-03-29 | Disposition: A | Discharge status: Home / Self Care | Attending: Emergency Medicine | Admitting: Emergency Medicine

## 2024-03-29 ENCOUNTER — Emergency Department (HOSPITAL_COMMUNITY)

## 2024-03-29 ENCOUNTER — Other Ambulatory Visit: Payer: Self-pay

## 2024-03-29 ENCOUNTER — Encounter (HOSPITAL_COMMUNITY): Payer: Self-pay | Admitting: *Deleted

## 2024-03-29 DIAGNOSIS — Z8673 Personal history of transient ischemic attack (TIA), and cerebral infarction without residual deficits: Secondary | ICD-10-CM | POA: Insufficient documentation

## 2024-03-29 DIAGNOSIS — Z79899 Other long term (current) drug therapy: Secondary | ICD-10-CM | POA: Diagnosis not present

## 2024-03-29 DIAGNOSIS — S63501A Unspecified sprain of right wrist, initial encounter: Secondary | ICD-10-CM | POA: Insufficient documentation

## 2024-03-29 DIAGNOSIS — W1830XA Fall on same level, unspecified, initial encounter: Secondary | ICD-10-CM | POA: Insufficient documentation

## 2024-03-29 DIAGNOSIS — M79631 Pain in right forearm: Secondary | ICD-10-CM | POA: Diagnosis not present

## 2024-03-29 DIAGNOSIS — M25531 Pain in right wrist: Secondary | ICD-10-CM | POA: Diagnosis not present

## 2024-03-29 DIAGNOSIS — I10 Essential (primary) hypertension: Secondary | ICD-10-CM | POA: Insufficient documentation

## 2024-03-29 DIAGNOSIS — S6991XA Unspecified injury of right wrist, hand and finger(s), initial encounter: Secondary | ICD-10-CM | POA: Diagnosis present

## 2024-03-29 DIAGNOSIS — Z7982 Long term (current) use of aspirin: Secondary | ICD-10-CM | POA: Insufficient documentation

## 2024-03-29 DIAGNOSIS — M79641 Pain in right hand: Secondary | ICD-10-CM | POA: Diagnosis not present

## 2024-03-29 DIAGNOSIS — M7989 Other specified soft tissue disorders: Secondary | ICD-10-CM | POA: Diagnosis not present

## 2024-03-29 MED ORDER — HYDROCODONE-ACETAMINOPHEN 5-325 MG PO TABS
1.0000 | ORAL_TABLET | Freq: Once | ORAL | Status: AC
  Administered 2024-03-29: 1 via ORAL
  Filled 2024-03-29: qty 1

## 2024-03-29 MED ORDER — ACETAMINOPHEN 325 MG PO TABS
ORAL_TABLET | ORAL | Status: AC
  Filled 2024-03-29: qty 2

## 2024-03-29 MED ORDER — ACETAMINOPHEN 325 MG PO TABS
650.0000 mg | ORAL_TABLET | Freq: Once | ORAL | Status: AC
  Administered 2024-03-29: 650 mg via ORAL

## 2024-03-29 NOTE — ED Provider Notes (Signed)
 [place] EMERGENCY DEPARTMENT AT [facility] Provider Note   CSN: [id number] Arrival date & time: 03/29/24  1900     Patient presents with: injured arm   [name] is a 48 y.o. female.   HPI   Patient has a history of multiple sclerosis syncope hypertension depression stroke.  Patient states she has to use braces in her legs to ambulate.  Patient was standing when she lost her balance yesterday and fell.  Patient landed on her right arm.  Patient has now noticed swelling in her wrist and hand.  It is tender.  She denies any other injuries.  Prior to Admission medications   Medication Sig Start Date End Date Taking? Authorizing Provider  acetaminophen  (TYLENOL ) 500 MG tablet Take 1,000 mg by mouth as needed for headache. Patient not taking: Reported on 09/14/2022    [provider]  albuterol  (PROVENTIL ) (2.5 MG/3ML) 0.083% nebulizer solution Take 3 mLs (2.5 mg total) by nebulization every 6 (six) hours as needed (asthma). Asthma. Patient taking differently: Take 2.5 mg by nebulization in the morning, at noon, and at bedtime. Asthma. 06/27/20   Vonn Hadassah LABOR, PA-C  albuterol  (VENTOLIN  HFA) 108 (90 Base) MCG/ACT inhaler Inhale 2 puffs into the lungs every 6 (six) hours as needed (asthma). Asthma. Patient taking differently: Inhale 2 puffs into the lungs as needed (asthma). 06/27/20   Vonn Hadassah LABOR, PA-C  amLODipine  (NORVASC ) 5 MG tablet Take 1 tablet (5 mg total) by mouth daily. 07/15/22   Clark, Meghan R, PA-C  aspirin  (ASPIRIN  81) 81 MG chewable tablet Chew 1 tablet (81 mg total) by mouth daily. 10/12/22   Leigh Venetia CROME, MD  Calcium Carbonate Antacid (TUMS PO) Take 1 tablet by mouth as needed.    [provider]  Cholecalciferol (VITAMIN D3) 25 MCG (1000 UT) capsule Take 1,000 Units by mouth daily. Patient not taking: Reported on 10/13/2023    [provider]  gabapentin  (NEURONTIN ) 300 MG capsule Take 1 capsule (300 mg total) by mouth 3  (three) times daily. 10/13/23   Leigh Venetia CROME, MD  metoprolol  tartrate (LOPRESSOR ) 25 MG tablet Take 1 tablet (25 mg total) by mouth 2 (two) times daily. 07/15/22   Clark, Meghan R, PA-C  Multiple Vitamins-Minerals (CENTRUM ULTRA WOMENS) TABS Take 1 tablet by mouth daily.    [provider]  NONFORMULARY OR COMPOUNDED ITEM Please dispense two wheeled rolling walker   Dx: G35, R53.1,l63.9 10/19/22   Leigh Venetia CROME, MD  oxyCODONE -acetaminophen  (PERCOCET ) 10-325 MG tablet Take 1 tablet by mouth 3 (three) times daily as needed. 11/17/22   [provider]    Allergies: Cephalexin, Naproxen, Penicillins, Codeine, and Sulfa antibiotics    Review of Systems  Updated Vital Signs BP (!) 125/92   Pulse 81   Temp 97.6 F (36.4 C)   Resp 18   Ht 1.499 m (4' 11)   Wt 42.2 kg   LMP 03/22/2024   SpO2 98%   BMI 18.79 kg/m   Physical Exam Vitals and nursing note reviewed.  Constitutional:      General: She is not in acute distress.    Appearance: She is well-developed.  HENT:     Head: Normocephalic and atraumatic.     Right Ear: External ear normal.     Left Ear: External ear normal.  Eyes:     General: No scleral icterus.       Right eye: No discharge.  Left eye: No discharge.     Conjunctiva/sclera: Conjunctivae normal.  Neck:     Trachea: No tracheal deviation.  Cardiovascular:     Rate and Rhythm: Normal rate.  Pulmonary:     Effort: Pulmonary effort is normal. No respiratory distress.     Breath sounds: No stridor.  Abdominal:     General: There is no distension.  Musculoskeletal:        General: Swelling and tenderness present. No deformity.     Right wrist: Tenderness and bony tenderness present. Decreased range of motion.     Cervical back: Neck supple.  Skin:    General: Skin is warm and dry.     Findings: No rash.  Neurological:     Mental Status: She is alert. Mental status is at baseline.     Cranial Nerves: No dysarthria or facial asymmetry.      Motor: No seizure activity.     (all labs ordered are listed, but only abnormal results are displayed) Labs Reviewed - No data to display  EKG: None  Radiology: DG Forearm Right Result Date: 03/29/2024 CLINICAL DATA:  Pain and swelling after a fall yesterday. EXAM: RIGHT FOREARM - 2 VIEW COMPARISON:  None Available. FINDINGS: There is no evidence of fracture or other focal bone lesions. Soft tissues are unremarkable. IMPRESSION: Negative. Electronically Signed   By: Elsie Gravely M.D.   On: 03/29/2024 20:51   DG Wrist Complete Right Result Date: 03/29/2024 CLINICAL DATA:  Pain and swelling of the right wrist after a fall yesterday. EXAM: RIGHT WRIST - COMPLETE 3+ VIEW COMPARISON:  None Available. FINDINGS: There is some dorsal soft tissue swelling of the right wrist. No evidence of acute fracture or dislocation. Joint spaces are normal. No focal bone lesion or bone destruction. IMPRESSION: Soft tissue swelling in the right wrist. No acute bony abnormalities. Electronically Signed   By: Elsie Gravely M.D.   On: 03/29/2024 20:50   DG Hand Complete Right Result Date: 03/29/2024 CLINICAL DATA:  Pain and swelling of the right hand after a fall yesterday. EXAM: RIGHT HAND - COMPLETE 3+ VIEW COMPARISON:  None Available. FINDINGS: There is no evidence of fracture or dislocation. There is no evidence of arthropathy or other focal bone abnormality. Soft tissues are unremarkable. IMPRESSION: Negative. Electronically Signed   By: Elsie Gravely M.D.   On: 03/29/2024 20:49     Procedures   Medications Ordered in the ED  acetaminophen  (TYLENOL ) tablet 650 mg (650 mg Oral Given 03/29/24 1957)  HYDROcodone -acetaminophen  (NORCO/VICODIN) 5-325 MG per tablet 1 tablet (1 tablet Oral Given 03/29/24 2102)    Clinical Course as of 03/29/24 2106  Fri Mar 29, 2024  2053 X-ray shows soft tissue swelling the wrist but no definite fracture [JK]  2053 Hand x-ray and forearm x-ray without fracture [JK]     Clinical Course User Index [JK] Randol Simmonds, MD                                 Medical Decision Making Problems Addressed: Wrist sprain, right, initial encounter: acute illness or injury that poses a threat to life or bodily functions  Amount and/or Complexity of Data Reviewed Radiology: ordered and independent interpretation performed.  Risk OTC drugs. Prescription drug management.   Patient presented to the ED for evaluation after fall.  Patient with swelling noted on the wrist.  No other injuries noted.  X-rays do not show  any signs of fracture.  Will place patient in a splint.  Outpatient follow-up with orthopedics as needed.     Final diagnoses:  Wrist sprain, right, initial encounter    ED Discharge Orders     None          Randol Simmonds, MD 03/29/24 2106

## 2024-03-29 NOTE — Discharge Instructions (Signed)
 The x-ray did not show any signs of fracture.  I have apply ice to help with the swelling.  Take over-the-counter medications as needed for pain.  Follow-up with an orthopedic doctor to be rechecked

## 2024-03-29 NOTE — ED Triage Notes (Signed)
 The pt fell backward yesterday and fell onto her rt arm  she has swelling in her forearm wrist and her hand  lmp one week ago

## 2024-04-10 DIAGNOSIS — R0602 Shortness of breath: Secondary | ICD-10-CM | POA: Diagnosis not present

## 2024-04-10 DIAGNOSIS — J309 Allergic rhinitis, unspecified: Secondary | ICD-10-CM | POA: Diagnosis not present

## 2024-04-10 DIAGNOSIS — J4530 Mild persistent asthma, uncomplicated: Secondary | ICD-10-CM | POA: Diagnosis not present

## 2024-04-11 DIAGNOSIS — E559 Vitamin D deficiency, unspecified: Secondary | ICD-10-CM | POA: Diagnosis not present

## 2024-04-11 DIAGNOSIS — F17210 Nicotine dependence, cigarettes, uncomplicated: Secondary | ICD-10-CM | POA: Diagnosis not present

## 2024-04-11 DIAGNOSIS — M129 Arthropathy, unspecified: Secondary | ICD-10-CM | POA: Diagnosis not present

## 2024-04-11 DIAGNOSIS — Z9181 History of falling: Secondary | ICD-10-CM | POA: Diagnosis not present

## 2024-04-11 DIAGNOSIS — G35 Multiple sclerosis: Secondary | ICD-10-CM | POA: Diagnosis not present

## 2024-04-11 DIAGNOSIS — Z131 Encounter for screening for diabetes mellitus: Secondary | ICD-10-CM | POA: Diagnosis not present

## 2024-04-11 DIAGNOSIS — I4510 Unspecified right bundle-branch block: Secondary | ICD-10-CM | POA: Diagnosis not present

## 2024-04-11 DIAGNOSIS — M25531 Pain in right wrist: Secondary | ICD-10-CM | POA: Diagnosis not present

## 2024-04-11 DIAGNOSIS — F419 Anxiety disorder, unspecified: Secondary | ICD-10-CM | POA: Diagnosis not present

## 2024-04-11 DIAGNOSIS — Z79899 Other long term (current) drug therapy: Secondary | ICD-10-CM | POA: Diagnosis not present

## 2024-04-11 DIAGNOSIS — M542 Cervicalgia: Secondary | ICD-10-CM | POA: Diagnosis not present

## 2024-04-11 DIAGNOSIS — M7918 Myalgia, other site: Secondary | ICD-10-CM | POA: Diagnosis not present

## 2024-04-11 DIAGNOSIS — I10 Essential (primary) hypertension: Secondary | ICD-10-CM | POA: Diagnosis not present

## 2024-04-11 DIAGNOSIS — Z681 Body mass index (BMI) 19 or less, adult: Secondary | ICD-10-CM | POA: Diagnosis not present
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
## 2024-04-13 DIAGNOSIS — J4530 Mild persistent asthma, uncomplicated: Secondary | ICD-10-CM | POA: Diagnosis not present

## 2024-04-15 DIAGNOSIS — Z79899 Other long term (current) drug therapy: Secondary | ICD-10-CM | POA: Diagnosis not present

## 2024-04-23 DIAGNOSIS — G35 Multiple sclerosis: Secondary | ICD-10-CM | POA: Diagnosis not present

## 2024-04-23 DIAGNOSIS — M7918 Myalgia, other site: Secondary | ICD-10-CM | POA: Diagnosis not present

## 2024-04-23 DIAGNOSIS — Z23 Encounter for immunization: Secondary | ICD-10-CM | POA: Diagnosis not present

## 2024-04-23 DIAGNOSIS — Z9181 History of falling: Secondary | ICD-10-CM | POA: Diagnosis not present

## 2024-04-23 DIAGNOSIS — Z681 Body mass index (BMI) 19 or less, adult: Secondary | ICD-10-CM | POA: Diagnosis not present

## 2024-04-23 DIAGNOSIS — I10 Essential (primary) hypertension: Secondary | ICD-10-CM | POA: Diagnosis not present

## 2024-04-23 DIAGNOSIS — Z79899 Other long term (current) drug therapy: Secondary | ICD-10-CM | POA: Diagnosis not present

## 2024-04-23 DIAGNOSIS — F17210 Nicotine dependence, cigarettes, uncomplicated: Secondary | ICD-10-CM | POA: Diagnosis not present

## 2024-04-23 DIAGNOSIS — M542 Cervicalgia: Secondary | ICD-10-CM | POA: Diagnosis not present

## 2024-04-23 DIAGNOSIS — M25531 Pain in right wrist: Secondary | ICD-10-CM | POA: Diagnosis not present

## 2024-04-23 DIAGNOSIS — F419 Anxiety disorder, unspecified: Secondary | ICD-10-CM | POA: Diagnosis not present

## 2024-04-25 ENCOUNTER — Encounter: Payer: Self-pay | Admitting: Neurology

## 2024-04-25 ENCOUNTER — Ambulatory Visit: Admitting: Neurology

## 2024-04-25 VITALS — BP 126/71 | HR 104 | Ht 59.0 in | Wt 90.0 lb

## 2024-04-25 DIAGNOSIS — I639 Cerebral infarction, unspecified: Secondary | ICD-10-CM

## 2024-04-25 DIAGNOSIS — S63501S Unspecified sprain of right wrist, sequela: Secondary | ICD-10-CM

## 2024-04-25 DIAGNOSIS — R9082 White matter disease, unspecified: Secondary | ICD-10-CM

## 2024-04-25 DIAGNOSIS — R29818 Other symptoms and signs involving the nervous system: Secondary | ICD-10-CM | POA: Diagnosis not present

## 2024-04-25 DIAGNOSIS — M792 Neuralgia and neuritis, unspecified: Secondary | ICD-10-CM | POA: Diagnosis not present

## 2024-04-25 MED ORDER — GABAPENTIN 300 MG PO CAPS: 300.0000 mg | ORAL_CAPSULE | Freq: Three times a day (TID) | ORAL | 3 refills | Status: AC

## 2024-04-25 NOTE — Addendum Note (Signed)
 Addended by: TERRIL CHARLIES MATSU on: 04/25/2024 03:45 PM   Modules accepted: Orders

## 2024-04-25 NOTE — Patient Instructions (Signed)
 I am referring you to orthopedics for your right wrist.  Restart aspirin  81 mg daily.  Continue gabapentin  300 mg three times daily for leg pain.  I will see you again in 1 year and likely repeat your MRI brain. Please let me know if you have any questions or concerns in the meantime.   If you have new difficulty speaking, face droop, numbness on one side of the body, weakness on one side of the body, or dizziness/imbalance, this could be the sign of a stroke. Don't wait, please call EMS and be evaluated at the nearest emergency room.   The physicians and staff at [facility] are committed to providing excellent care. You may receive a survey requesting feedback about your experience at our office. We strive to receive very good responses to the survey questions. If you feel that your experience would prevent you from giving the office a very good  response, please contact our office to try to remedy the situation. We may be reached at [phone]. Thank you for taking the time out of your busy day to complete the survey.  Venetia Potters, MD [facility]  Preventing Falls at [facility] are common, often dreaded events in the lives of older people. Aside from the obvious injuries and even death that may result, fall can cause wide-ranging consequences including loss of independence, mental decline, decreased activity and mobility. Younger people are also at risk of falling, especially those with chronic illnesses and fatigue.  Ways to reduce risk for falling Examine diet and medications. Warm foods and alcohol dilate blood vessels, which can lead to dizziness when standing. Sleep aids, antidepressants and pain medications can also increase the likelihood of a fall.  Get a vision exam. Poor vision, cataracts and glaucoma increase the chances of falling.  Check foot gear. Shoes should fit snugly and have a sturdy, nonskid sole and a broad, low heel  Participate in a  physician-approved exercise program to build and maintain muscle strength and improve balance and coordination. Programs that use ankle weights or stretch bands are excellent for muscle-strengthening. Water aerobics programs and low-impact Tai Chi programs have also been shown to improve balance and coordination.  Increase vitamin D  intake. Vitamin D  improves muscle strength and increases the amount of calcium the body is able to absorb and deposit in bones.  How to prevent falls from common hazards Floors - Remove all loose wires, cords, and throw rugs. Minimize clutter. Make sure rugs are anchored and smooth. Keep furniture in its usual place.  Chairs -- Use chairs with straight backs, armrests and firm seats. Add firm cushions to existing pieces to add height.  Bathroom - Install grab bars and non-skid tape in the tub or shower. Use a bathtub transfer bench or a shower chair with a back support Use an elevated toilet seat and/or safety rails to assist standing from a low surface. Do not use towel racks or bathroom tissue holders to help you stand.  Lighting - Make sure halls, stairways, and entrances are well-lit. Install a night light in your bathroom or hallway. Make sure there is a light switch at the top and bottom of the staircase. Turn lights on if you get up in the middle of the night. Make sure lamps or light switches are within reach of the bed if you have to get up during the night.  Kitchen - Install non-skid rubber mats near the sink and stove. Clean spills immediately. Store frequently used utensils,  pots, pans between waist and eye level. This helps prevent reaching and bending. Sit when getting things out of lower cupboards.  Living room/ Bedrooms - Place furniture with wide spaces in between, giving enough room to move around. Establish a route through the living room that gives you something to hold onto as you walk.  Stairs - Make sure treads, rails, and rugs are secure. Install  a rail on both sides of the stairs. If stairs are a threat, it might be helpful to arrange most of your activities on the lower level to reduce the number of times you must climb the stairs.  Entrances and doorways - Install metal handles on the walls adjacent to the doorknobs of all doors to make it more secure as you travel through the doorway.  Tips for maintaining balance Keep at least one hand free at all times. Try using a backpack or fanny pack to hold things rather than carrying them in your hands. Never carry objects in both hands when walking as this interferes with keeping your balance.  Attempt to swing both arms from front to back while walking. This might require a conscious effort if Parkinson's disease has diminished your movement. It will, however, help you to maintain balance and posture, and reduce fatigue.  Consciously lift your feet off of the ground when walking. Shuffling and dragging of the feet is a common culprit in losing your balance.  When trying to navigate turns, use a U technique of facing forward and making a wide turn, rather than pivoting sharply.  Try to stand with your feet shoulder-length apart. When your feet are close together for any length of time, you increase your risk of losing your balance and falling.  Do one thing at a time. Don't try to walk and accomplish another task, such as reading or looking around. The decrease in your automatic reflexes complicates motor function, so the less distraction, the better.  Do not wear rubber or gripping soled shoes, they might catch on the floor and cause tripping.  Move slowly when changing positions. Use deliberate, concentrated movements and, if needed, use a grab bar or walking aid. Count 15 seconds between each movement. For example, when rising from a seated position, wait 15 seconds after standing to begin walking.  If balance is a continuous problem, you might want to consider a walking aid such as a  cane, walking stick, or walker. Once you've mastered walking with help, you might be ready to try it on your own again.

## 2024-04-26 DIAGNOSIS — Z79899 Other long term (current) drug therapy: Secondary | ICD-10-CM | POA: Diagnosis not present

## 2024-04-28 ENCOUNTER — Encounter (HOSPITAL_COMMUNITY): Payer: Self-pay | Admitting: Emergency Medicine

## 2024-04-28 ENCOUNTER — Emergency Department (HOSPITAL_COMMUNITY)
Admission: EM | Admit: 2024-04-28 | Discharge: 2024-04-28 | Disposition: A | Discharge status: Home / Self Care | Attending: Emergency Medicine | Admitting: Emergency Medicine

## 2024-04-28 DIAGNOSIS — D649 Anemia, unspecified: Secondary | ICD-10-CM | POA: Diagnosis not present

## 2024-04-28 DIAGNOSIS — Z7982 Long term (current) use of aspirin: Secondary | ICD-10-CM | POA: Diagnosis not present

## 2024-04-28 DIAGNOSIS — Z79899 Other long term (current) drug therapy: Secondary | ICD-10-CM | POA: Insufficient documentation

## 2024-04-28 DIAGNOSIS — D72829 Elevated white blood cell count, unspecified: Secondary | ICD-10-CM | POA: Insufficient documentation

## 2024-04-28 DIAGNOSIS — R770 Abnormality of albumin: Secondary | ICD-10-CM | POA: Diagnosis not present

## 2024-04-28 DIAGNOSIS — M79641 Pain in right hand: Secondary | ICD-10-CM | POA: Diagnosis present

## 2024-04-28 DIAGNOSIS — L089 Local infection of the skin and subcutaneous tissue, unspecified: Secondary | ICD-10-CM | POA: Diagnosis not present

## 2024-04-28 LAB — CBC
HCT: 35.7 % — ABNORMAL LOW (ref 36.0–46.0)
Hemoglobin: 11.6 g/dL — ABNORMAL LOW (ref 12.0–15.0)
MCH: 31.5 pg (ref 26.0–34.0)
MCHC: 32.5 g/dL (ref 30.0–36.0)
MCV: 97 fL (ref 80.0–100.0)
Platelets: 282 K/uL (ref 150–400)
RBC: 3.68 MIL/uL — ABNORMAL LOW (ref 3.87–5.11)
RDW: 14.6 % (ref 11.5–15.5)
WBC: 11.3 K/uL — ABNORMAL HIGH (ref 4.0–10.5)
nRBC: 0 % (ref 0.0–0.2)

## 2024-04-28 LAB — COMPREHENSIVE METABOLIC PANEL WITH GFR
ALT: 29 U/L (ref 0–44)
AST: 30 U/L (ref 15–41)
Albumin: 3.3 g/dL — ABNORMAL LOW (ref 3.5–5.0)
Alkaline Phosphatase: 78 U/L (ref 38–126)
Anion gap: 8 (ref 5–15)
BUN: 11 mg/dL (ref 6–20)
CO2: 26 mmol/L (ref 22–32)
Calcium: 9 mg/dL (ref 8.9–10.3)
Chloride: 104 mmol/L (ref 98–111)
Creatinine, Ser: 0.82 mg/dL (ref 0.44–1.00)
GFR, Estimated: >60 mL/min (ref >60)
Glucose, Bld: 104 mg/dL — ABNORMAL HIGH (ref 70–99)
Potassium: 3.9 mmol/L (ref 3.5–5.1)
Sodium: 138 mmol/L (ref 135–145)
Total Bilirubin: 0.5 mg/dL (ref 0.0–1.2)
Total Protein: 6.2 g/dL — ABNORMAL LOW (ref 6.5–8.1)

## 2024-04-28 MED ORDER — DOXYCYCLINE HYCLATE 100 MG PO CAPS
100.0000 mg | ORAL_CAPSULE | Freq: Two times a day (BID) | ORAL | 0 refills | Status: AC
Start: 2024-04-28 — End: ?

## 2024-04-28 MED ORDER — OXYCODONE-ACETAMINOPHEN 5-325 MG PO TABS
1.0000 | ORAL_TABLET | Freq: Once | ORAL | Status: AC
  Administered 2024-04-28: 1 via ORAL
  Filled 2024-04-28: qty 1

## 2024-04-28 MED ORDER — DOXYCYCLINE HYCLATE 100 MG PO TABS
100.0000 mg | ORAL_TABLET | Freq: Once | ORAL | Status: AC
  Administered 2024-04-28: 100 mg via ORAL
  Filled 2024-04-28: qty 1

## 2024-04-28 NOTE — ED Provider Notes (Signed)
 [place] EMERGENCY DEPARTMENT AT [facility] Provider Note   CSN: [id number] Arrival date & time: 04/28/24  2023     Patient presents with: Hand Pain   [name] is a 48 y.o. female past medical history significant for MS, hypertension, anxiety, and stroke presents today for evaluation of a right hand wound from a brace that she has been wearing.  Patient reports erythema, swelling, green foul-smelling drainage, and discomfort.  Patient denies fever, chills, nausea, vomiting, numbness, tingling, any other injuries at this time.    Hand Pain       Prior to Admission medications   Medication Sig Start Date End Date Taking? Authorizing Provider  doxycycline  (VIBRAMYCIN ) 100 MG capsule Take 1 capsule (100 mg total) by mouth 2 (two) times daily. 04/28/24  Yes Crystallee Werden N, PA-C  acetaminophen  (TYLENOL ) 500 MG tablet Take 1,000 mg by mouth as needed for headache.    [provider]  albuterol  (PROVENTIL ) (2.5 MG/3ML) 0.083% nebulizer solution Take 3 mLs (2.5 mg total) by nebulization every 6 (six) hours as needed (asthma). Asthma. 06/27/20   Vonn Hadassah LABOR, PA-C  albuterol  (VENTOLIN  HFA) 108 (90 Base) MCG/ACT inhaler Inhale 2 puffs into the lungs every 6 (six) hours as needed (asthma). Asthma. 06/27/20   Vonn Hadassah LABOR, PA-C  amLODipine  (NORVASC ) 5 MG tablet Take 1 tablet (5 mg total) by mouth daily. Patient not taking: Reported on 04/25/2024 07/15/22   Gretta Sayres R, PA-C  aspirin  (ASPIRIN  81) 81 MG chewable tablet Chew 1 tablet (81 mg total) by mouth daily. Patient not taking: Reported on 04/25/2024 10/12/22   Leigh Venetia CROME, MD  Calcium Carbonate Antacid (TUMS PO) Take 1 tablet by mouth as needed.    [provider]  Cholecalciferol (VITAMIN D3) 25 MCG (1000 UT) capsule Take 1,000 Units by mouth daily.    [provider]  gabapentin  (NEURONTIN ) 300 MG capsule Take 1 capsule (300 mg total) by mouth 3 (three) times daily. 04/25/24   Leigh Venetia CROME, MD  metoprolol  tartrate (LOPRESSOR ) 25 MG tablet Take 1 tablet (25 mg total) by mouth 2 (two) times daily. 07/15/22   Clark, Meghan R, PA-C  Multiple Vitamins-Minerals (CENTRUM ULTRA WOMENS) TABS Take 1 tablet by mouth daily.    [provider]  NONFORMULARY OR COMPOUNDED ITEM Please dispense two wheeled rolling walker   Dx: G35, R53.1,l63.9 10/19/22   Leigh Venetia CROME, MD  oxyCODONE -acetaminophen  (PERCOCET ) 10-325 MG tablet Take 1 tablet by mouth 3 (three) times daily as needed. Patient not taking: Reported on 04/25/2024 11/17/22   [provider]    Allergies: Cephalexin, Naproxen, Penicillins, Codeine, and Sulfa antibiotics    Review of Systems  Skin:  Positive for wound.    Updated Vital Signs BP (!) 137/98 (BP Location: Right Arm)   Pulse 87   Temp 97.9 F (36.6 C)   Resp 16   Ht 4' 11 (1.499 m)   Wt 41.7 kg   LMP 03/22/2024   SpO2 100%   BMI 18.58 kg/m   Physical Exam Vitals and nursing note reviewed.  Constitutional:      General: She is not in acute distress.    Appearance: Normal appearance. She is well-developed. She is not ill-appearing.  HENT:     Head: Normocephalic and atraumatic.     Right Ear: External ear normal.     Left Ear: External ear normal.     Nose: Nose normal.  Eyes:     Conjunctiva/sclera:  Conjunctivae normal.  Cardiovascular:     Rate and Rhythm: Normal rate and regular rhythm.     Pulses: Normal pulses.     Heart sounds: Normal heart sounds. No murmur heard. Pulmonary:     Effort: Pulmonary effort is normal. No respiratory distress.     Breath sounds: Normal breath sounds.  Abdominal:     Palpations: Abdomen is soft.     Tenderness: There is no abdominal tenderness.  Musculoskeletal:        General: No swelling.     Cervical back: Neck supple.  Skin:    General: Skin is warm and dry.     Capillary Refill: Capillary refill takes less than 2 seconds.     Findings: Erythema and lesion present.     Comments:  Patient with approximately 3 cm area of skin breakdown with scant purulent drainage, mild swelling, and immediately surrounding erythema between her right thumb and index finger.  Patient does have mild reduced ROM secondary to pain but is able to flex and extend both her  Neurological:     General: No focal deficit present.     Mental Status: She is alert and oriented to person, place, and time.  Psychiatric:        Mood and Affect: Mood normal.     (all labs ordered are listed, but only abnormal results are displayed) Labs Reviewed  CBC - Abnormal; Notable for the following components:      Result Value   WBC 11.3 (*)    RBC 3.68 (*)    Hemoglobin 11.6 (*)    HCT 35.7 (*)    All other components within normal limits  COMPREHENSIVE METABOLIC PANEL WITH GFR - Abnormal; Notable for the following components:   Glucose, Bld 104 (*)    Total Protein 6.2 (*)    Albumin 3.3 (*)    All other components within normal limits    EKG: None  Radiology: No results found.   Procedures   Medications Ordered in the ED  oxyCODONE -acetaminophen  (PERCOCET /ROXICET) 5-325 MG per tablet 1 tablet (has no administration in time range)  doxycycline  (VIBRA -TABS) tablet 100 mg (has no administration in time range)                                    Medical Decision Making Amount and/or Complexity of Data Reviewed Labs: ordered.   This patient presents to the ED for concern of hand wound differential diagnosis includes superficial infection, cellulitis, abscess, necrotizing fasciitis  Additional history obtained  Additional history obtained from Electronic Medical Record External records from outside source obtained and reviewed including neurology notes   Lab Tests:  I Ordered, and personally interpreted labs.  The pertinent results include: CMP with mildly reduced albumin at 3.3 and total protein at 6.2, mild leukocytosis at 11.3, mild anemia at 11.6   Medicines ordered and  prescription drug management:  I ordered medication including doxycycline     I have reviewed the patients home medicines and have made adjustments as needed   Problem List / ED Course:  Considered for admission or further workup however patient's vital signs, physical exam, and labs are reassuring.  Patient symptoms likely due to a superficial bacterial skin infection.  Patient given outpatient course of antibiotics and return precautions.  I feel patient is safe for discharge at this time.        Final diagnoses:  Superficial  bacterial skin infection    ED Discharge Orders          Ordered    doxycycline  (VIBRAMYCIN ) 100 MG capsule  2 times daily        04/28/24 2259               Francis Ileana SAILOR, PA-C 04/28/24 2301    Towana Ozell BROCKS, MD 04/29/24 1010

## 2024-04-28 NOTE — Discharge Instructions (Addendum)
 Today you were seen for superficial skin infection.  Please pick up your antibiotic and take as prescribed.  You may alternate Tylenol /Motrin as needed for pain.  Please return to the ED if you have worsening pain, fever that does not go down with Tylenol  or Motrin, or red streaking up your arm.  Please schedule an appointment with Dr. Alyse with orthopedics for further evaluation and treatment of your wrist pain.  Thank you for letting us  treat you today. After reviewing your labs, I feel you are safe to go home. Please follow up with your PCP in the next several days and provide them with your records from this visit. Return to the Emergency Room if pain becomes severe or symptoms worsen.

## 2024-04-28 NOTE — ED Triage Notes (Signed)
 Patient reports wound on right hand from wrist brace that she has been wearing. Swelling and redness noted to hand. Denies fevers. Reports green drainage with foul odor - not currently draining at present.

## 2024-04-28 NOTE — ED Triage Notes (Addendum)
 Pt has injury from wrist brace which has caused pain and swelling to her right hand.  Pt is here for evaluation of this.  No fever or chills, injury is not draining at this time. Picture placed on chart.

## 2024-05-07 DIAGNOSIS — Z79899 Other long term (current) drug therapy: Secondary | ICD-10-CM | POA: Diagnosis not present

## 2024-06-05 DIAGNOSIS — F17210 Nicotine dependence, cigarettes, uncomplicated: Secondary | ICD-10-CM | POA: Diagnosis not present

## 2024-06-05 DIAGNOSIS — M542 Cervicalgia: Secondary | ICD-10-CM | POA: Diagnosis not present

## 2024-06-05 DIAGNOSIS — M25531 Pain in right wrist: Secondary | ICD-10-CM | POA: Diagnosis not present

## 2024-06-05 DIAGNOSIS — Z681 Body mass index (BMI) 19 or less, adult: Secondary | ICD-10-CM | POA: Diagnosis not present

## 2024-06-05 DIAGNOSIS — Z9181 History of falling: Secondary | ICD-10-CM | POA: Diagnosis not present

## 2024-06-05 DIAGNOSIS — Z79899 Other long term (current) drug therapy: Secondary | ICD-10-CM | POA: Diagnosis not present

## 2024-06-05 DIAGNOSIS — M7918 Myalgia, other site: Secondary | ICD-10-CM | POA: Diagnosis not present

## 2024-06-05 DIAGNOSIS — R030 Elevated blood-pressure reading, without diagnosis of hypertension: Secondary | ICD-10-CM | POA: Diagnosis not present

## 2024-06-11 DIAGNOSIS — Z79899 Other long term (current) drug therapy: Secondary | ICD-10-CM | POA: Diagnosis not present

## 2024-07-03 DIAGNOSIS — I10 Essential (primary) hypertension: Secondary | ICD-10-CM | POA: Diagnosis not present

## 2024-07-03 DIAGNOSIS — F419 Anxiety disorder, unspecified: Secondary | ICD-10-CM | POA: Diagnosis not present

## 2024-07-03 DIAGNOSIS — M25572 Pain in left ankle and joints of left foot: Secondary | ICD-10-CM | POA: Diagnosis not present

## 2024-07-03 DIAGNOSIS — Z681 Body mass index (BMI) 19 or less, adult: Secondary | ICD-10-CM | POA: Diagnosis not present

## 2024-07-03 DIAGNOSIS — M542 Cervicalgia: Secondary | ICD-10-CM | POA: Diagnosis not present

## 2024-07-03 DIAGNOSIS — M25571 Pain in right ankle and joints of right foot: Secondary | ICD-10-CM | POA: Diagnosis not present

## 2024-07-03 DIAGNOSIS — Z79899 Other long term (current) drug therapy: Secondary | ICD-10-CM | POA: Diagnosis not present

## 2024-07-03 DIAGNOSIS — M25531 Pain in right wrist: Secondary | ICD-10-CM | POA: Diagnosis not present

## 2024-07-03 DIAGNOSIS — G35D Multiple sclerosis, unspecified: Secondary | ICD-10-CM | POA: Diagnosis not present

## 2024-07-03 DIAGNOSIS — F17210 Nicotine dependence, cigarettes, uncomplicated: Secondary | ICD-10-CM | POA: Diagnosis not present

## 2024-07-03 DIAGNOSIS — Z9181 History of falling: Secondary | ICD-10-CM | POA: Diagnosis not present

## 2024-07-08 DIAGNOSIS — Z79899 Other long term (current) drug therapy: Secondary | ICD-10-CM | POA: Diagnosis not present

## 2024-07-24 DIAGNOSIS — J309 Allergic rhinitis, unspecified: Secondary | ICD-10-CM | POA: Diagnosis not present

## 2024-07-24 DIAGNOSIS — J4530 Mild persistent asthma, uncomplicated: Secondary | ICD-10-CM | POA: Diagnosis not present

## 2024-07-29 DIAGNOSIS — F419 Anxiety disorder, unspecified: Secondary | ICD-10-CM | POA: Diagnosis not present

## 2024-07-29 DIAGNOSIS — F17210 Nicotine dependence, cigarettes, uncomplicated: Secondary | ICD-10-CM | POA: Diagnosis not present

## 2024-07-29 DIAGNOSIS — Z9181 History of falling: Secondary | ICD-10-CM | POA: Diagnosis not present

## 2024-07-29 DIAGNOSIS — Z79899 Other long term (current) drug therapy: Secondary | ICD-10-CM | POA: Diagnosis not present

## 2024-07-29 DIAGNOSIS — M25571 Pain in right ankle and joints of right foot: Secondary | ICD-10-CM | POA: Diagnosis not present

## 2024-07-29 DIAGNOSIS — G35D Multiple sclerosis, unspecified: Secondary | ICD-10-CM | POA: Diagnosis not present

## 2024-07-29 DIAGNOSIS — I10 Essential (primary) hypertension: Secondary | ICD-10-CM | POA: Diagnosis not present

## 2024-07-29 DIAGNOSIS — Z681 Body mass index (BMI) 19 or less, adult: Secondary | ICD-10-CM | POA: Diagnosis not present

## 2024-07-29 DIAGNOSIS — M25572 Pain in left ankle and joints of left foot: Secondary | ICD-10-CM | POA: Diagnosis not present

## 2024-08-05 DIAGNOSIS — Z79899 Other long term (current) drug therapy: Secondary | ICD-10-CM | POA: Diagnosis not present

## 2025-04-25 ENCOUNTER — Ambulatory Visit: Admitting: Neurology
